# Patient Record
Sex: Male | Born: 1950
Health system: Southern US, Community
[De-identification: ages and names within clinical notes are randomized; demographics above are authoritative.]

## PROBLEM LIST (undated history)

## (undated) DIAGNOSIS — Z8619 Personal history of other infectious and parasitic diseases: Secondary | ICD-10-CM

## (undated) DIAGNOSIS — H209 Unspecified iridocyclitis: Secondary | ICD-10-CM

## (undated) DIAGNOSIS — H3320 Serous retinal detachment, unspecified eye: Secondary | ICD-10-CM

## (undated) DIAGNOSIS — C439 Malignant melanoma of skin, unspecified: Secondary | ICD-10-CM

## (undated) DIAGNOSIS — K219 Gastro-esophageal reflux disease without esophagitis: Secondary | ICD-10-CM

## (undated) DIAGNOSIS — E785 Hyperlipidemia, unspecified: Secondary | ICD-10-CM

## (undated) DIAGNOSIS — I10 Essential (primary) hypertension: Secondary | ICD-10-CM

## (undated) DIAGNOSIS — C801 Malignant (primary) neoplasm, unspecified: Secondary | ICD-10-CM

## (undated) DIAGNOSIS — T8859XA Other complications of anesthesia, initial encounter: Secondary | ICD-10-CM

## (undated) DIAGNOSIS — Z9289 Personal history of other medical treatment: Secondary | ICD-10-CM

## (undated) HISTORY — PX: EYE SURGERY: SHX253

## (undated) HISTORY — PX: APPENDECTOMY: SHX54

## (undated) HISTORY — PX: CATARACT EXTRACTION W/ INTRAOCULAR LENS  IMPLANT, BILATERAL: SHX1307

## (undated) HISTORY — DX: Hyperlipidemia, unspecified: E78.5

## (undated) HISTORY — PX: OTHER SURGICAL HISTORY: SHX169

## (undated) HISTORY — DX: Malignant melanoma of skin, unspecified: C43.9

## (undated) HISTORY — DX: Serous retinal detachment, unspecified eye: H33.20

## (undated) HISTORY — DX: Essential (primary) hypertension: I10

## (undated) HISTORY — DX: Personal history of other medical treatment: Z92.89

## (undated) HISTORY — PX: CORONARY ANGIOGRAM: SHX5786

## (undated) HISTORY — DX: Gastro-esophageal reflux disease without esophagitis: K21.9

## (undated) HISTORY — DX: Unspecified iridocyclitis: H20.9

---

## 1981-02-18 DIAGNOSIS — H209 Unspecified iridocyclitis: Secondary | ICD-10-CM

## 1981-02-18 HISTORY — DX: Unspecified iridocyclitis: H20.9

## 1996-02-19 DIAGNOSIS — Z9289 Personal history of other medical treatment: Secondary | ICD-10-CM

## 1996-02-19 HISTORY — DX: Personal history of other medical treatment: Z92.89

## 1998-11-24 ENCOUNTER — Ambulatory Visit (HOSPITAL_COMMUNITY): Admission: RE | Admit: 1998-11-24 | Discharge: 1998-11-24 | Payer: Self-pay | Admitting: Gastroenterology

## 1998-11-24 ENCOUNTER — Encounter (INDEPENDENT_AMBULATORY_CARE_PROVIDER_SITE_OTHER): Payer: Self-pay | Admitting: Specialist

## 1998-11-24 HISTORY — PX: ESOPHAGOGASTRODUODENOSCOPY: SHX1529

## 2001-11-18 ENCOUNTER — Encounter: Payer: Self-pay | Admitting: Family Medicine

## 2001-11-18 LAB — CONVERTED CEMR LAB: PSA: 0.5 ng/mL

## 2003-12-10 ENCOUNTER — Emergency Department: Payer: Self-pay | Admitting: Unknown Physician Specialty

## 2003-12-10 ENCOUNTER — Other Ambulatory Visit: Payer: Self-pay

## 2003-12-13 ENCOUNTER — Ambulatory Visit: Payer: Self-pay | Admitting: General Surgery

## 2003-12-16 ENCOUNTER — Ambulatory Visit: Payer: Self-pay | Admitting: Cardiology

## 2004-05-18 ENCOUNTER — Ambulatory Visit: Payer: Self-pay | Admitting: Unknown Physician Specialty

## 2004-05-25 ENCOUNTER — Ambulatory Visit: Payer: Self-pay | Admitting: Family Medicine

## 2004-05-28 ENCOUNTER — Ambulatory Visit: Payer: Self-pay | Admitting: Family Medicine

## 2005-06-18 ENCOUNTER — Encounter: Payer: Self-pay | Admitting: Family Medicine

## 2005-06-18 LAB — CONVERTED CEMR LAB: PSA: 0.4 ng/mL

## 2005-07-15 ENCOUNTER — Ambulatory Visit: Payer: Self-pay | Admitting: Family Medicine

## 2005-07-22 ENCOUNTER — Ambulatory Visit: Payer: Self-pay | Admitting: Family Medicine

## 2006-08-18 ENCOUNTER — Encounter: Payer: Self-pay | Admitting: Family Medicine

## 2006-08-18 LAB — CONVERTED CEMR LAB: PSA: 0.6 ng/mL

## 2006-08-21 ENCOUNTER — Encounter: Payer: Self-pay | Admitting: Family Medicine

## 2006-08-28 ENCOUNTER — Encounter: Payer: Self-pay | Admitting: Family Medicine

## 2006-08-28 DIAGNOSIS — K921 Melena: Secondary | ICD-10-CM | POA: Insufficient documentation

## 2006-08-28 DIAGNOSIS — K219 Gastro-esophageal reflux disease without esophagitis: Secondary | ICD-10-CM | POA: Insufficient documentation

## 2006-08-28 DIAGNOSIS — M083 Juvenile rheumatoid polyarthritis (seronegative): Secondary | ICD-10-CM | POA: Insufficient documentation

## 2006-08-28 DIAGNOSIS — I10 Essential (primary) hypertension: Secondary | ICD-10-CM | POA: Insufficient documentation

## 2006-09-02 ENCOUNTER — Ambulatory Visit: Payer: Self-pay | Admitting: Family Medicine

## 2006-11-07 ENCOUNTER — Encounter: Payer: Self-pay | Admitting: Family Medicine

## 2007-09-01 ENCOUNTER — Encounter: Payer: Self-pay | Admitting: Family Medicine

## 2007-09-07 ENCOUNTER — Telehealth: Payer: Self-pay | Admitting: Family Medicine

## 2007-09-08 ENCOUNTER — Encounter: Payer: Self-pay | Admitting: Family Medicine

## 2007-11-04 ENCOUNTER — Telehealth: Payer: Self-pay | Admitting: Family Medicine

## 2007-11-13 ENCOUNTER — Encounter: Payer: Self-pay | Admitting: Family Medicine

## 2007-12-02 ENCOUNTER — Ambulatory Visit: Payer: Self-pay | Admitting: Family Medicine

## 2008-02-03 ENCOUNTER — Telehealth: Payer: Self-pay | Admitting: Family Medicine

## 2008-02-15 ENCOUNTER — Telehealth: Payer: Self-pay | Admitting: Family Medicine

## 2008-07-16 ENCOUNTER — Ambulatory Visit: Payer: Self-pay | Admitting: Unknown Physician Specialty

## 2008-07-16 ENCOUNTER — Encounter: Payer: Self-pay | Admitting: Family Medicine

## 2008-07-16 LAB — HM COLONOSCOPY

## 2008-07-22 DIAGNOSIS — K209 Esophagitis, unspecified without bleeding: Secondary | ICD-10-CM | POA: Insufficient documentation

## 2008-07-22 DIAGNOSIS — D126 Benign neoplasm of colon, unspecified: Secondary | ICD-10-CM | POA: Insufficient documentation

## 2008-07-22 DIAGNOSIS — K2970 Gastritis, unspecified, without bleeding: Secondary | ICD-10-CM | POA: Insufficient documentation

## 2008-07-22 DIAGNOSIS — K2990 Gastroduodenitis, unspecified, without bleeding: Secondary | ICD-10-CM

## 2008-10-28 ENCOUNTER — Telehealth: Payer: Self-pay | Admitting: Internal Medicine

## 2008-11-15 ENCOUNTER — Telehealth (INDEPENDENT_AMBULATORY_CARE_PROVIDER_SITE_OTHER): Payer: Self-pay | Admitting: *Deleted

## 2008-11-17 ENCOUNTER — Encounter: Payer: Self-pay | Admitting: Family Medicine

## 2008-11-24 ENCOUNTER — Ambulatory Visit: Payer: Self-pay | Admitting: Family Medicine

## 2009-01-12 ENCOUNTER — Emergency Department: Payer: Self-pay | Admitting: Emergency Medicine

## 2009-01-12 ENCOUNTER — Encounter: Payer: Self-pay | Admitting: Family Medicine

## 2009-01-18 ENCOUNTER — Encounter: Payer: Self-pay | Admitting: Family Medicine

## 2009-01-18 ENCOUNTER — Ambulatory Visit: Payer: Self-pay | Admitting: Cardiology

## 2009-01-20 ENCOUNTER — Encounter: Payer: Self-pay | Admitting: Family Medicine

## 2009-01-20 ENCOUNTER — Ambulatory Visit: Payer: Self-pay | Admitting: General Surgery

## 2009-06-12 ENCOUNTER — Ambulatory Visit: Payer: Self-pay | Admitting: General Surgery

## 2009-06-14 ENCOUNTER — Encounter: Payer: Self-pay | Admitting: Family Medicine

## 2009-06-14 ENCOUNTER — Ambulatory Visit: Payer: Self-pay | Admitting: Unknown Physician Specialty

## 2009-06-14 HISTORY — PX: ESOPHAGOGASTRODUODENOSCOPY: SHX1529

## 2009-09-21 ENCOUNTER — Encounter (INDEPENDENT_AMBULATORY_CARE_PROVIDER_SITE_OTHER): Payer: Self-pay | Admitting: *Deleted

## 2009-11-30 ENCOUNTER — Telehealth (INDEPENDENT_AMBULATORY_CARE_PROVIDER_SITE_OTHER): Payer: Self-pay | Admitting: *Deleted

## 2009-12-06 ENCOUNTER — Encounter: Payer: Self-pay | Admitting: Family Medicine

## 2009-12-14 ENCOUNTER — Ambulatory Visit: Payer: Self-pay | Admitting: Family Medicine

## 2010-03-20 NOTE — Assessment & Plan Note (Signed)
Summary: CPX/CLE   Vital Signs:  Patient Profile:   60 Years Old Male Height:     70 inches Weight:      195 pounds Temp:     97.8 degrees F oral Pulse rate:   64 / minute Pulse rhythm:   regular BP sitting:   130 / 80  (left arm) Cuff size:   regular  Vitals Entered By: Providence Crosby (September 02, 2006 11:41 AM)               Chief Complaint:  CPX / STATES DOES NOT NEED HEMOCULT CARDS HAD COLONOSCOPY LAST YEAR.  History of Present Illness: No complaints except with lack of attention, has noticed hitting doorjams goig thru doors (for instance). Has not affected his abilities in the OR, more an attention problem or lack thereof. Generally achy the last few weeks. No acute problem. Not particularly having joint aches.  Overall feels reasonably well. Continues to tolerate Diovan/hct well. Has had muliple retinal tears causing floaters and heralding poss retinal detachment so has seen Dr Melinda Crutch frequently for Laser Trmnts in the last 14 mos.  Current Allergies (reviewed today): ! DEMEROL (MEPERIDINE HCL)  Past Surgical History:    Bilateral knee contractures as a child    JRA, 17yoa  HLA B27+    Appendectomy  14yoa    Iritis    Prednisone, ASA, NSAIDS, Phenylbutisone  Flare as Cief Resident 1983    ECHO- EF 55%, Thallium wnl 11/91    Macular Degen Fourescein Angiog (Dr Luciana Axe)  1996    ETT with Juanito Doom 12/98    Colonoscopy- Medoff, polypectomy  11/24/98    EGD- esophgitis with stricture, HH, gastritis 11/24/98    CATH, min dz, mild LAD EF 72% 12/16/03    Colonoscopy- wnl- no polyps 2004    EGD-Elliott wnl, bx negative, H.Pylori negative 05/19/04    Risk Factors:  Passive smoke exposure:  no Drug use:  no HIV high-risk behavior:  no Caffeine use:  3 drinks per day Alcohol use:  yes    Type:  all types    Drinks per day:  1    Has patient --       Felt need to cut down:  no       Been annoyed by complaints:  no       Felt guilty about drinking:  no       Needed eye  opener in the morning:  no    Counseled to quit/cut down alcohol use:  no Exercise:  yes    Times per week:  5    Type:  walking Seatbelt use:  100 %   Review of Systems  General      Denies chills, fatigue, fever, loss of appetite, malaise, sleep disorder, sweats, weakness, and weight loss.  Eyes      Denies blurring, discharge, double vision, eye irritation, eye pain, halos, itching, light sensitivity, red eye, vision loss-1 eye, and vision loss-both eyes.      sees Dr Melinda Crutch, multiple retinal tears last year.  ENT      Complains of decreased hearing.      Denies difficulty swallowing, ear discharge, earache, hoarseness, nasal congestion, nosebleeds, postnasal drainage, ringing in ears, sinus pressure, and sore throat.      mild  CV      Denies bluish discoloration of lips or nails, chest pain or discomfort, difficulty breathing at night, difficulty breathing while lying down, fainting, fatigue, leg cramps with  exertion, lightheadness, near fainting, palpitations, shortness of breath with exertion, swelling of feet, swelling of hands, and weight gain.  Resp      Denies chest discomfort, chest pain with inspiration, cough, coughing up blood, excessive snoring, hypersomnolence, morning headaches, pleuritic, shortness of breath, sputum productive, and wheezing.  GI      Denies abdominal pain, bloody stools, change in bowel habits, constipation, dark tarry stools, diarrhea, excessive appetite, gas, hemorrhoids, indigestion, loss of appetite, nausea, vomiting, vomiting blood, and yellowish skin color.  GU      Complains of nocturia.      Denies decreased libido, discharge, dysuria, erectile dysfunction, genital sores, hematuria, incontinence, urinary frequency, and urinary hesitancy.      improved  MS      Denies joint pain, joint redness, joint swelling, loss of strength, low back pain, mid back pain, muscle aches, muscle , cramps, muscle weakness, stiffness, and thoracic  pain.  Derm      Denies changes in color of skin, changes in nail beds, dryness, excessive perspiration, flushing, hair loss, insect bite(s), itching, lesion(s), poor wound healing, and rash.   Physical Exam  General:     Well-developed,well-nourished,in no acute distress; alert,appropriate and cooperative throughout examination Head:     Normocephalic and atraumatic without obvious abnormalities. No apparent alopecia or balding. Eyes:     Conjunctiva clear bilaterally.  Ears:     External ear exam shows no significant lesions or deformities.  Otoscopic examination reveals clear canals, tympanic membranes are intact bilaterally without bulging, retraction, inflammation or discharge. Hearing is grossly normal bilaterally. Nose:     External nasal examination shows no deformity or inflammation. Nasal mucosa are pink and moist without lesions or exudates. Mouth:     Oral mucosa and oropharynx without lesions or exudates.  Teeth in good repair. Neck:     No deformities, masses, or tenderness noted. Chest Wall:     No deformities, masses, tenderness or gynecomastia noted. Breasts:     No masses or gynecomastia noted Lungs:     Normal respiratory effort, chest expands symmetrically. Lungs are clear to auscultation, no crackles or wheezes. Heart:     Normal rate and regular rhythm. S1 and S2 normal without gallop, murmur, click, rub or other extra sounds. Abdomen:     Bowel sounds positive,abdomen soft and non-tender without masses, organomegaly or hernias noted. Rectal:     No external abnormalities noted. Normal sphincter tone. No rectal masses or tenderness. G neg. Genitalia:     Testes bilaterally descended without nodularity, tenderness or masses. No scrotal masses or lesions. No penis lesions or urethral discharge. Prostate:     Prostate gland firm and smooth, no enlargement, nodularity, tenderness, mass, asymmetry or induration. 20gms. Msk:     No deformity or scoliosis noted  of thoracic or lumbar spine.   Pulses:     R and L carotid,radial,femoral,dorsalis pedis and posterior tibial pulses are full and equal bilaterally Extremities:     No clubbing, cyanosis, edema, or deformity noted with normal full range of motion of all joints.   Neurologic:     No cranial nerve deficits noted. Station and gait are normal. Plantar reflexes are down-going bilaterally. DTRs are symmetrical throughout. Sensory, motor and coordinative functions appear intact. Skin:     Intact without suspicious lesions or rashes. Has B9 flat hyperpigmented macule R uppper chest just below the R mid clavicle and B9 allergic rash in left neck area probably from his surgical mask (got out of  surgery just before getting here.) Cervical Nodes:     No lymphadenopathy noted Inguinal Nodes:     No significant adenopathy Psych:     Cognition and judgment appear intact. Alert and cooperative with normal attention span and concentration. No apparent delusions, illusions, hallucinations    Impression & Recommendations:  Problem # 1:  HEALTH MAINTENANCE EXAM (ICD-V70.0) Assessment: Comment Only  Orders: No Charge Patient Arrived ([id number])   Problem # 2:  HYPERTENSION (ICD-401.9) Assessment: Unchanged  His updated medication list for this problem includes:    Diovan Hct 160-25 Mg Tabs (Valsartan-hydrochlorothiazide) .Marland Kitchen... Take one by mouth daily  BP today: 130/80   Problem # 3:  HYPERCHOLESTEROLEMIA, 160/ HDL 36 /LDL 108 (ICD-272.0) Assessment: Improved Cont as is LDL 103 HDL 39  Trig 111  Problem # 4:  HEMATOCHEZIA (ICD-578.1) Assessment: Improved Colonoscopy nml last year.  Problem # 5:  GERD (ICD-530.81) Assessment: Improved Stable.  Medications Added to Medication List This Visit: 1)  Bl Vitamin E 400 Unit Caps (Vitamin e) .Marland Kitchen.. 1 qd   Patient Instructions: 1)  RTC 1 year, sooner as needed.    Prescriptions: DIOVAN HCT 160-25 MG  TABS (VALSARTAN-HYDROCHLOROTHIAZIDE) Take one  by mouth daily  #30 x prn   Entered and Authorized by:   Shaune Leeks MD   Signed by:   Shaune Leeks MD on 09/02/2006   Method used:   Print then Give to Patient   RxID:   [id number]       Appended Document: CPX/CLE Script for [employer] called to Karin Golden , [place] [phone] for prolonged cough with min white production for grtr one month.

## 2010-03-20 NOTE — Progress Notes (Signed)
  Phone Note Call from Patient   Summary of Call: Patient called  requesting order for labs prior to his cpx coming up. Rx for labs faxed to [phone] at patients request.  Initial call taken by: Melody Comas,  November 30, 2009 2:54 PM

## 2010-03-20 NOTE — Progress Notes (Signed)
  Phone Note Call from Patient Call back at Work Phone [phone]   Caller: Patient Call For: Dr.Schaller Summary of Call: Pt. is coming in for CPX on 11/24/08 @ 8:00.  He requested a lab order be faxed to his office,so he can have his lab results before the CPX.  Please fax lab order to pt @ [phone]. Initial call taken by: Beau Fanny,  November 15, 2008 11:01 AM  Follow-up for Phone Call        Done. Follow-up by: Shaune Leeks MD,  November 15, 2008 1:55 PM  Additional Follow-up for Phone Call Additional follow up Details #1::        Lab Order faxed to patient. Additional Follow-up by: Beau Fanny,  November 15, 2008 1:59 PM

## 2010-03-20 NOTE — Procedures (Signed)
Summary: Upper GI Endoscopy by Dr.Robert [facility]  Upper GI Endoscopy by Dr.Robert [facility]   Imported By: Beau Fanny 07/22/2008 08:59:43  _____________________________________________________________________  External Attachment:    Type:   Image     Comment:   External Document

## 2010-03-20 NOTE — Progress Notes (Signed)
----   Converted from flag ---- ---- 09/07/2007 9:46 AM, Providence Crosby wrote: DR. Doristine Counter CALLED JUST AFEW MINUTES AGO  WANTS YOU TO CALL HIM WHEN YOU CAN AT [phone] C/O SOME DIZZINESS AND BP UP SOME // JUST WANTS YOUR OPINION ------------------------------ Was travelling over the weekend, driving in car a lot both ways with junk food, increased salt intake...give 24 hrs tio see if improves. Has done a procedure and seeing pts in office w/o diff...does not sound like stroke. Give 24 hrs and recheck...call me if no improvement.

## 2010-03-20 NOTE — Letter (Signed)
Summary: Nadara Eaton letter  [place] [facility]  [address] [place], [place] [address]   Phone: [phone]  Fax: [phone]       09/21/2009 MRN: [id number]  Brentyn Venters [address] [place], [place]  [address]  Dear Mr. [facility] [place] [place] announce the retirement of Arta Silence, M.D., from full-time practice at the [facility] office effective August 17, 2009 and his plans of returning part-time.  It is important to Dr. Hetty Ely and to our practice that you understand that [facility] has seven physicians in our office for your health care needs.  We will continue to offer the same exceptional care that you have today.    Dr. Hetty Ely has spoken to many of you about his plans for retirement and returning part-time in the fall.   We will continue to work with you through the transition to schedule appointments for you in the office and meet the high standards that [facility] is committed to.   Again, it is with great pleasure that we share the news that Dr. Hetty Ely will return to [facility] in October of 2011 with a reduced schedule.    If you have any questions, or would like to request an appointment with one of our physicians, please call us at [phone] and press the option for Scheduling an appointment.  We take pleasure in providing you with excellent patient care and look forward to seeing you at your next office visit.  Our [facility] Physicians are:  Tillman Abide, M.D. Laurita Quint, M.D. Roxy Manns, M.D. Kerby Nora, M.D. Hannah Beat, M.D. Ruthe Mannan, M.D. We proudly welcomed Raechel Ache, M.D. and Eustaquio Boyden, M.D. to the practice in July/August 2011.  Sincerely,  [facility]

## 2010-03-20 NOTE — Progress Notes (Signed)
Summary: DR.BRYNETT  Phone Note Call from Patient   Caller: Patient Call For: DR. Epimenio Schetter Summary of Call: HAS COUGH FEELS LIKE HE IS COUGHING UP A LUNG  FOR 5 TO 6 DAYS CLEAR WHITE SPUTUM NO FEVER; WIFE STATES HE SHOULD CALL YOU // PAGER NUMBER [phone] Initial call taken by: Providence Crosby,  February 03, 2008 10:12 AM  Follow-up for Phone Call        Discussed and suggested approach. he'll call if continues. Follow-up by: Shaune Leeks MD,  February 03, 2008 10:29 AM

## 2010-03-20 NOTE — Assessment & Plan Note (Signed)
Summary: CPX/CLE   Vital Signs:  Patient profile:   60 year old male Height:      70 inches Weight:      196 pounds BMI:     28.22 Temp:     97.7 degrees F oral Pulse rate:   60 / minute Pulse rhythm:   regular BP sitting:   112 / 70  (left arm) Cuff size:   regular  Vitals Entered By: Sydell Axon LPN (November 24, 2008 8:09 AM) CC: 30 Minute checkup   History of Present Illness: Pt here for Comp Exam, doing well with no complaints.  Preventive Screening-Counseling & Management  Alcohol-Tobacco     Alcohol drinks/day: 2     Alcohol type: all types     Smoking Status: never     Passive Smoke Exposure: no  Caffeine-Diet-Exercise     Caffeine use/day: 3     Does Patient Exercise: yes     Type of exercise: walking     Times/week: 3  Problems Prior to Update: 1)  Esophagitis  (ICD-530.10) 2)  Gastritis  (ICD-535.50) 3)  Colonic Polyps, Adenomatous  (ICD-211.3) 4)  Health Maintenance Exam  (ICD-V70.0) 5)  Hypercholesterolemia, 160/ Hdl 36 /ldl 108  (ICD-272.0) 6)  Hematochezia  (ICD-578.1) 7)  Dysphagia, Unspec (STRICTURE VIA EGD)  (ICD-787.20) 8)  Tinnitus, Scondary Asa, Nasal Septrum Perf  (ICD-388.30) 9)  Reiter's Disease (URETHR, IRIDO, ARTHR)  (ICD-099.3) 10)  Arthritis, Rheumatoid, Juvenile  (ICD-714.30) 11)  Mitral Valve Prolapse, Mild With H/o Pat  (ICD-424.0) 12)  Hypertension  (ICD-401.9) 13)  Gerd  (ICD-530.81)  Medications Prior to Update: 1)  Icaps .... Take One By Mouth Daily 2)  Diovan Hct 160-25 Mg  Tabs (Valsartan-Hydrochlorothiazide) .... Take One By Mouth Daily 3)  Bl Vitamin E 400 Unit  Caps (Vitamin E) .Marland Kitchen.. 1 Qd 4)  Zithromax Tri-Pak 500 Mg Tabs (Azithromycin) .... As Dir 5)  Tessalon 200 Mg Caps (Benzonatate) .... One Cap By Mouth Three Times A Day As Needed Cough 6)  Tussionex Pennkinetic Er 8-10 Mg/80ml Lqcr (Chlorpheniramine-Hydrocodone) .... One Tsp By Mouth At Night As Needed Cough.  Allergies: 1)  ! Demerol (Meperidine Hcl)  Past  History:  Past Medical History: Last updated: 08/28/2006 GERD Hypertension  Family History: Last updated: 11/24/2008 Father: A 79  one-vessel coronary artery disease HTN  Lipitor Mother: dec died from intracerebral hemm on Coumadin with hypertension, bypass graft, a stroke from atrial fibrillation, also on Lipitor. She continues to smoke and has had leg claudication.  Siblings: One younger brother by 7 years mild cardiac disease  Intermmittent SVT Diabetes- MGM. Intestional polyps in a GM at 60 years of age  Social History: Last updated: 08/28/2006 Marital Status: Married Children: One son and daughter Occupation: Careers adviser at [employer]  Risk Factors: Alcohol Use: 2 (11/24/2008) Caffeine Use: 3 (11/24/2008) Exercise: yes (11/24/2008)  Risk Factors: Smoking Status: never (11/24/2008) Passive Smoke Exposure: no (11/24/2008)  Past Surgical History: Bilateral knee contractures as a child JRA, 17yoa  HLA B27+ Appendectomy  14yoa Iritis    Prednisone, ASA, NSAIDS, Phenylbutisone  Flare as Cief Resident 1983 ECHO- EF 55%, Thallium wnl 11/91 Macular Degen Fourescein Angiog (Dr Luciana Axe)  1996 ETT with Juanito Doom 12/98 Colonoscopy- Medoff, polypectomy  11/24/98 EGD- esophgitis with stricture, HH, gastritis 11/24/98 CATH, min dz, mild LAD EF 72% 12/16/03 Colonoscopy- wnl- no polyps 2004  repeat 09 EGD-Elliott wnl, bx negative, H.Pylori negative 05/19/04 Colonoscopy Polyp Int Hemms Divertics throughout (Dr Mechele Collin)  07/16/08   5 yrs(suggested 3 yrs)  Family History: Father: A 79  one-vessel coronary artery disease HTN  Lipitor Mother: dec died from intracerebral hemm on Coumadin with hypertension, bypass graft, a stroke from atrial fibrillation, also on Lipitor. She continues to smoke and has had leg claudication.  Siblings: One younger brother by 7 years mild cardiac disease  Intermmittent SVT Diabetes- MGM. Intestional polyps in a GM at 60 years of  age  Review of Systems General:  Denies chills, fatigue, fever, loss of appetite, malaise, sleep disorder, sweats, weakness, and weight loss. Eyes:  Complains of blurring; denies discharge, double vision, eye irritation, eye pain, halos, itching, light sensitivity, red eye, vision loss-1 eye, and vision loss-both eyes; Sees dr Melinda Crutch. ENT:  Complains of ringing in ears; chronic saw Dr Elenore Rota. CV:  Denies bluish discoloration of lips or nails, chest pain or discomfort, difficulty breathing at night, difficulty breathing while lying down, fainting, fatigue, leg cramps with exertion, lightheadness, near fainting, palpitations, shortness of breath with exertion, swelling of feet, swelling of hands, and weight gain. Resp:  Complains of cough; denies chest discomfort, chest pain with inspiration, coughing up blood, excessive snoring, hypersomnolence, morning headaches, pleuritic, shortness of breath, sputum productive, and wheezing; see HPI. GI:  Denies abdominal pain, bloody stools, change in bowel habits, constipation, dark tarry stools, diarrhea, excessive appetite, gas, hemorrhoids, indigestion, loss of appetite, nausea, vomiting, vomiting blood, and yellowish skin color. GU:  Denies decreased libido, discharge, dysuria, erectile dysfunction, genital sores, hematuria, incontinence, nocturia, urinary frequency, and urinary hesitancy; slower. MS:  Complains of joint pain; JRA still with nonlimiting arthropathy.. Derm:  Denies changes in color of skin, changes in nail beds, dryness, excessive perspiration, flushing, hair loss, insect bite(s), itching, lesion(s), poor wound healing, and rash. Neuro:  Denies brief paralysis, difficulty with concentration, disturbances in coordination, falling down, headaches, inability to speak, memory loss, numbness, poor balance, seizures, sensation of room spinning, tingling, tremors, visual disturbances, and weakness.  Physical Exam  General:   Well-developed,well-nourished,in no acute distress; alert,appropriate and cooperative throughout examination Head:  Normocephalic and atraumatic without obvious abnormalities. No apparent alopecia or balding. Eyes:  Conjunctiva clear bilaterally.  Ears:  External ear exam shows no significant lesions or deformities.  Otoscopic examination reveals clear canals, tympanic membranes are intact bilaterally without bulging, retraction, inflammation or discharge. Hearing is grossly normal bilaterally. Nose:  External nasal examination shows no deformity or inflammation. Nasal mucosa are pink and moist without lesions or exudates. Septal defect due to high ASA as a child for JRA Mouth:  Oral mucosa and oropharynx without lesions or exudates.  Teeth in good repair. Neck:  No deformities, masses, or tenderness noted. Chest Wall:  No deformities, masses, tenderness or gynecomastia noted. Breasts:  No masses or gynecomastia noted Lungs:  Normal respiratory effort, chest expands symmetrically. Lungs are clear to auscultation, no crackles or wheezes. Heart:  Normal rate and regular rhythm. S1 and S2 normal without gallop, murmur, click, rub or other extra sounds. Abdomen:  Bowel sounds positive,abdomen soft and non-tender without masses, organomegaly or hernias noted. Liver edge felt on deep palpation, still 8cm MCL. Rectal:  No external abnormalities noted. Normal sphincter tone. No rectal masses or tenderness. G neg. Genitalia:  Testes bilaterally descended without nodularity, tenderness or masses. No scrotal masses or lesions. No penis lesions or urethral discharge. Prostate:  Prostate gland firm and smooth, no enlargement, nodularity, tenderness, mass, minimal asymmetry with left pole slightly larger than right. No palpable  induration. 20gms. Msk:  No deformity or scoliosis noted of thoracic or lumbar spine.   Pulses:  R and L carotid,radial,femoral,dorsalis pedis and posterior tibial pulses are full and  equal bilaterally Extremities:  No clubbing, cyanosis, edema, or deformity noted with normal full range of motion of all joints.  Minimal evidence of arthropathy grossly. Neurologic:  No cranial nerve deficits noted. Station and gait are normal. Plantar reflexes are down-going bilaterally. DTRs are symmetrical throughout. Sensory, motor and coordinative functions appear intact. Skin:  Intact without suspicious lesions or rashes except 1 cm hyperpigmented lesion just left side of manubrial area of superior sternum, unchaged. Cervical Nodes:  No lymphadenopathy noted Inguinal Nodes:  No significant adenopathy Psych:  Cognition and judgment appear intact. Alert and cooperative with normal attention span and concentration. No apparent delusions, illusions, hallucinations   Impression & Recommendations:  Problem # 1:  HEALTH MAINTENANCE EXAM (ICD-V70.0) Assessment Comment Only Awaiting labs from [facility]. Will call him with results.  Problem # 2:  ESOPHAGITIS (ICD-530.10) Assessment: Unchanged Stable, thought to possibly be eosinophillic esophagitis. Abhors pills , esp steroids.  Problem # 3:  GASTRITIS (ICD-535.50) Assessment: Unchanged Stable. Takes PPI occas.  Problem # 4:  COLONIC POLYPS, ADENOMATOUS (ICD-211.3) Assessment: Unchanged Will have repeart Colonoscopy in 2015.   Problem # 5:  HYPERCHOLESTEROLEMIA, 160/ HDL 36 /LDL 108 (ICD-272.0) Assessment: Unchanged Awaiting recheck.  Problem # 6:  HEMATOCHEZIA (ICD-578.1) Assessment: Improved None recently.  Problem # 7:  TINNITUS, SCONDARY ASA, NASAL SEPTRUM PERF (ICD-388.30) Assessment: Unchanged Sees ENT. Has been aware of mild dysequilibrium at times...does not appear significantly progressive. Do not think Meniere's at this juncture. Hearing loss ios high freqeuncy and does not appear progressive.  Problem # 8:  MITRAL VALVE PROLAPSE, MILD WITH H/O PAT (ICD-424.0) Assessment: Unchanged Stable, no click heard.  Assymptomatic.  Problem # 9:  HYPERTENSION (ICD-401.9) Assessment: Unchanged Stable His updated medication list for this problem includes:    Diovan Hct 160-25 Mg Tabs (Valsartan-hydrochlorothiazide) .Marland Kitchen... Take one by mouth daily  BP today: 112/70 Prior BP: 120/70 (12/02/2007)  Complete Medication List: 1)  Icaps  .... Take one by mouth daily 2)  Diovan Hct 160-25 Mg Tabs (Valsartan-hydrochlorothiazide) .... Take one by mouth daily  Current Allergies (reviewed today): ! DEMEROL (MEPERIDINE HCL)   Preventive Care Screening  Colonoscopy:    Date:  07/16/2008    Results:  Adenomatous Polyp

## 2010-03-20 NOTE — Procedures (Signed)
Summary: Upper GI Endoscopy by Dr.Jonne Rote [facility]  Upper GI Endoscopy by Dr.Rosalina [facility]   Imported By: Beau Fanny 06/19/2009 15:07:00  _____________________________________________________________________  External Attachment:    Type:   Image     Comment:   External Document  Appended Document: Upper GI Endoscopy by Dr.Kathleen Tamm [facility]    Clinical Lists Changes  Observations: Added new observation of PAST SURG HX: Bilateral knee contractures as a child JRA, 17yoa  HLA B27+ Appendectomy  14yoa Iritis    Prednisone, ASA, NSAIDS, Phenylbutisone  Flare as Cief Resident 1983 ECHO- EF 55%, Thallium wnl 11/91 Macular Degen Fourescein Angiog (Dr Luciana Axe)  1996 ETT with Juanito Doom 12/98 Colonoscopy- Medoff, polypectomy  11/24/98 EGD- esophgitis with stricture, HH, gastritis 11/24/98 CATH, min dz, mild LAD EF 72% 12/16/03 Colonoscopy- wnl- no polyps 2004  repeat 09 EGD- wnl, bx negative, H.Pylori negative (Dr Mechele Collin) 05/19/04 Colonoscopy Polyp Int Hemms Divertics throughout (Dr Mechele Collin) 07/16/08   5 yrs(suggested 3 yrs) EGD Esophagitis poss [facility] Gastritis (Dr Mechele Collin) 06/14/2009  (06/19/2009 17:47)       Past Surgical History:    Bilateral knee contractures as a child    JRA, 17yoa  HLA B27+    Appendectomy  14yoa    Iritis    Prednisone, ASA, NSAIDS, Phenylbutisone  Flare as Cief Resident 1983    ECHO- EF 55%, Thallium wnl 11/91    Macular Degen Fourescein Angiog (Dr Luciana Axe)  1996    ETT with Juanito Doom 12/98    Colonoscopy- Medoff, polypectomy  11/24/98    EGD- esophgitis with stricture, HH, gastritis 11/24/98    CATH, min dz, mild LAD EF 72% 12/16/03    Colonoscopy- wnl- no polyps 2004  repeat 09    EGD- wnl, bx negative, H.Pylori negative (Dr Mechele Collin) 05/19/04    Colonoscopy Polyp Int Hemms Divertics throughout (Dr Mechele Collin) 07/16/08   5 yrs(suggested 3 yrs)    EGD Esophagitis poss [facility] Gastritis (Dr Mechele Collin) 06/14/2009

## 2010-03-20 NOTE — Progress Notes (Signed)
Summary: wants order for labs  Phone Note Call from Patient   Caller: Patient Call For: dr schaller Summary of Call: Pt is requesting an order for lab work be faxed to [facility]- he needs to schedule a physical but he said he would do that after he gets his labs.  He wants order faxed to [facility] on [address],  fax [phone] Initial call taken by: Lowella Petties,  November 04, 2007 10:00 AM  Follow-up for Phone Call        Script written. Follow-up by: Shaune Leeks MD,  November 04, 2007 10:38 AM  Additional Follow-up for Phone Call Additional follow up Details #1::        order faxed Additional Follow-up by: Lowella Petties,  November 04, 2007 11:48 AM      Appended Document: wants order for labs faxed labs to his office

## 2010-03-20 NOTE — Assessment & Plan Note (Signed)
Summary: CPX PER GAIL?DLO   Vital Signs:  Patient Profile:   60 Years Old Male Height:     70 inches Weight:      190 pounds Temp:     97.4 degrees F oral Pulse rate:   64 / minute Pulse rhythm:   regular BP sitting:   120 / 70  (left arm) Cuff size:   regular  Vitals Entered By: Providence Crosby (December 02, 2007 10:37 AM)                 Chief Complaint:  check up.  History of Present Illness: Pt has had tinnitus for about ten months...had it before when on high dose ASA as adolescent for JRA Also has tingling     Prior Medications Reviewed Using: Patient Recall  Current Allergies (reviewed today): ! DEMEROL (MEPERIDINE HCL)  Past Surgical History:    Bilateral knee contractures as a child    JRA, 17yoa  HLA B27+    Appendectomy  14yoa    Iritis    Prednisone, ASA, NSAIDS, Phenylbutisone  Flare as Cief Resident 1983    ECHO- EF 55%, Thallium wnl 11/91    Macular Degen Fourescein Angiog (Dr Luciana Axe)  1996    ETT with Juanito Doom 12/98    Colonoscopy- Medoff, polypectomy  11/24/98    EGD- esophgitis with stricture, HH, gastritis 11/24/98    CATH, min dz, mild LAD EF 72% 12/16/03    Colonoscopy- wnl- no polyps 2004  repeat 09    EGD-Elliott wnl, bx negative, H.Pylori negative 05/19/04   Family History:    Father: Alive with one-vessel coronary artery disease, on Lipitor    Mother: dec died from intracerebral hemm on Coumadin with hypertension, bypass graft, a stroke from atrial fibrillation, also on Lipitor. She continues to smoke and has had leg claudication.     Siblings: One younger brother by 7 years mild cardiac disease    Diabetes- MGM.    Intestional polyps in a GM at 60 years of age       Risk Factors:     Drinks per day:  2   Review of Systems  General      Complains of weight loss.  Eyes      Denies blurring, discharge, double vision, eye irritation, eye pain, halos, itching, light sensitivity, red eye, vision loss-1 eye, and vision loss-both  eyes.      bilat retinal detachment  ENT      Complains of ringing in ears.      Denies decreased hearing, difficulty swallowing, ear discharge, earache, hoarseness, nasal congestion, nosebleeds, postnasal drainage, sinus pressure, and sore throat.  Resp      Denies chest discomfort, chest pain with inspiration, cough, coughing up blood, excessive snoring, hypersomnolence, morning headaches, pleuritic, shortness of breath, sputum productive, and wheezing.  GI      Complains of indigestion.  GU      Denies decreased libido, discharge, dysuria, erectile dysfunction, genital sores, hematuria, incontinence, nocturia, urinary frequency, and urinary hesitancy.  MS      Denies joint pain, joint redness, joint swelling, loss of strength, low back pain, mid back pain, muscle aches, muscle , cramps, muscle weakness, stiffness, and thoracic pain.  Derm      Denies changes in color of skin, changes in nail beds, dryness, excessive perspiration, flushing, hair loss, insect bite(s), itching, lesion(s), poor wound healing, and rash.   Physical Exam  General:     Well-developed,well-nourished,in no acute distress;  alert,appropriate and cooperative throughout examination Head:     Normocephalic and atraumatic without obvious abnormalities. No apparent alopecia or balding. Eyes:     Conjunctiva clear bilaterally.  Ears:     External ear exam shows no significant lesions or deformities.  Otoscopic examination reveals clear canals, tympanic membranes are intact bilaterally without bulging, retraction, inflammation or discharge. Hearing is grossly normal bilaterally. Nose:     External nasal examination shows no deformity or inflammation. Nasal mucosa are pink and moist without lesions or exudates. Mouth:     Oral mucosa and oropharynx without lesions or exudates.  Teeth in good repair. Neck:     No deformities, masses, or tenderness noted. Chest Wall:     No deformities, masses, tenderness or  gynecomastia noted. Breasts:     No masses or gynecomastia noted Lungs:     Normal respiratory effort, chest expands symmetrically. Lungs are clear to auscultation, no crackles or wheezes. Heart:     Normal rate and regular rhythm. S1 and S2 normal without gallop, murmur, click, rub or other extra sounds. Abdomen:     Bowel sounds positive,abdomen soft and non-tender without masses, organomegaly or hernias noted. Liver edge felt on deep palpation, still 8cm MCL. Rectal:     No external abnormalities noted. Normal sphincter tone. No rectal masses or tenderness. G neg. Genitalia:     Testes bilaterally descended without nodularity, tenderness or masses. No scrotal masses or lesions. No penis lesions or urethral discharge. Prostate:     Prostate gland firm and smooth, no enlargement, nodularity, tenderness, mass, asymmetry or induration. 20gms. Msk:     No deformity or scoliosis noted of thoracic or lumbar spine.   Pulses:     R and L carotid,radial,femoral,dorsalis pedis and posterior tibial pulses are full and equal bilaterally Extremities:     No clubbing, cyanosis, edema, or deformity noted with normal full range of motion of all joints.   Neurologic:     No cranial nerve deficits noted. Station and gait are normal. Plantar reflexes are down-going bilaterally. DTRs are symmetrical throughout. Sensory, motor and coordinative functions appear intact. Skin:     Intact without suspicious lesions or rashes except 1 cm hyperpigmented lesion just left side of manubrial area of superior sternum. Cervical Nodes:     No lymphadenopathy noted Inguinal Nodes:     No significant adenopathy Psych:     Cognition and judgment appear intact. Alert and cooperative with normal attention span and concentration. No apparent delusions, illusions, hallucinations    Impression & Recommendations:  Problem # 1:  HEALTH MAINTENANCE EXAM (ICD-V70.0) Assessment: Deteriorated  Problem # 2:   HYPERCHOLESTEROLEMIA, 160/ HDL 36 /LDL 108 (ICD-272.0) Assessment: Unchanged Will improve with continued wt loss. LDL 130+  Problem # 3:  DYSPHAGIA, UNSPECIFIED (ICD-787.20) Assessment: Improved Significantly improved with his weight loss.  A true Motivator!!  Problem # 4:  TINNITUS, SCONDARY ASA, NASAL SEPTRUM PERF (ICD-388.30) Assessment: Unchanged  Problem # 5:  HYPERTENSION (ICD-401.9) Assessment: Improved  His updated medication list for this problem includes:    Diovan Hct 160-25 Mg Tabs (Valsartan-hydrochlorothiazide) .Marland Kitchen... Take one by mouth daily  BP today: 120/70 Prior BP: 130/80 (09/02/2006)   Complete Medication List: 1)  Icaps  .... Take one by mouth daily 2)  Diovan Hct 160-25 Mg Tabs (Valsartan-hydrochlorothiazide) .... Take one by mouth daily 3)  Bl Vitamin E 400 Unit Caps (Vitamin e) .Marland Kitchen.. 1 qd   Patient Instructions: 1)  RTC as needed.  2)  Encouraged  to cont low carb lifestyle.   ]  Influenza Immunization History:    Influenza # 1:  Fluvax 3+ (11/13/2007)

## 2010-03-20 NOTE — Progress Notes (Signed)
  Phone Note Call from Patient   Caller: Patient Summary of Call: has been exposed to sick daughter Has had cough for 3-4 days some clear to cream colored mucus No sig fever Not SOB  doesn't really feel sick  P: discussed symptomatic care    will consider antibiotics if worsens or doesn't clear Initial call taken by: Cindee Salt MD,  October 28, 2008 8:45 AM

## 2010-03-20 NOTE — Letter (Signed)
Summary: [facility] Reappointment Application  [facility] Reappointment Application   Imported By: Beau Fanny 09/02/2007 08:56:12  _____________________________________________________________________  External Attachment:    Type:   Image     Comment:   External Document

## 2010-03-20 NOTE — Procedures (Signed)
Summary: Colonoscopy by Dr.Robert [facility]  Colonoscopy by Dr.Robert [facility]   Imported By: Beau Fanny 07/22/2008 08:54:54  _____________________________________________________________________  External Attachment:    Type:   Image     Comment:   External Document  Appended Document: Colonoscopy by Dr.Robert [facility]    Clinical Lists Changes  Problems: Changed problem from DYSPHAGIA, UNSPECIFIED (ICD-787.20) to DYSPHAGIA, UNSPEC (STRICTURE VIA EGD) (ICD-787.20) Added new problem of COLONIC POLYPS, ADENOMATOUS (ICD-211.3) Added new problem of GASTRITIS (ICD-535.50) Added new problem of ESOPHAGITIS (ICD-530.10) Observations: Added new observation of PAST SURG HX: Bilateral knee contractures as a child JRA, 17yoa  HLA B27+ Appendectomy  14yoa Iritis    Prednisone, ASA, NSAIDS, Phenylbutisone  Flare as Cief Resident 1983 ECHO- EF 55%, Thallium wnl 11/91 Macular Degen Fourescein Angiog (Dr Luciana Axe)  1996 ETT with Juanito Doom 12/98 Colonoscopy- Medoff, polypectomy  11/24/98 EGD- esophgitis with stricture, HH, gastritis 11/24/98 CATH, min dz, mild LAD EF 72% 12/16/03 Colonoscopy- wnl- no polyps 2004  repeat 09 EGD-Elliott wnl, bx negative, H.Pylori negative 05/19/04 Colonoscopy Polyp Int Hemms Divertics throughout (Dr Mechele Collin) 07/16/08 EGD Reflux Esophagitis Bx  Gastritis (Dr Mechele Collin) 07/16/08  (07/22/2008 9:46)       Past Surgical History:    Bilateral knee contractures as a child    JRA, 17yoa  HLA B27+    Appendectomy  14yoa    Iritis    Prednisone, ASA, NSAIDS, Phenylbutisone  Flare as Cief Resident 1983    ECHO- EF 55%, Thallium wnl 11/91    Macular Degen Fourescein Angiog (Dr Luciana Axe)  1996    ETT with Juanito Doom 12/98    Colonoscopy- Medoff, polypectomy  11/24/98    EGD- esophgitis with stricture, HH, gastritis 11/24/98    CATH, min dz, mild LAD EF 72% 12/16/03    Colonoscopy- wnl- no polyps 2004  repeat 09    EGD-Elliott wnl, bx negative, H.Pylori  negative 05/19/04    Colonoscopy Polyp Int Hemms Divertics throughout (Dr Mechele Collin) 07/16/08    EGD Reflux Esophagitis Bx  Gastritis (Dr Mechele Collin) 07/16/08

## 2010-03-20 NOTE — Assessment & Plan Note (Signed)
Summary: CPX...   Vital Signs:  Patient profile:   60 year old male Height:      70 inches Weight:      193.25 pounds BMI:     27.83 Temp:     97.8 degrees F oral Pulse rate:   60 / minute Pulse rhythm:   regular BP sitting:   118 / 78  (left arm) Cuff size:   regular  Vitals Entered By: Sydell Axon LPN (December 14, 2009 8:27 AM) CC: 30 minute checkup   History of Present Illness: Pt here for Comp Exam, feels well with no complaints. He has some issues. Sep he had posterior headaches daily. Bp was mildly up during this time. Has some difficulty with walking in the dark, visual cues lower.  Has less empathy lately.   Preventive Screening-Counseling & Management  Alcohol-Tobacco     Alcohol drinks/day: 2     Alcohol type: all types     Smoking Status: never     Passive Smoke Exposure: no  Caffeine-Diet-Exercise     Caffeine use/day: 3     Does Patient Exercise: yes     Type of exercise: walking     Times/week: 3  Problems Prior to Update: 1)  Esophagitis  (ICD-530.10) 2)  Gastritis  (ICD-535.50) 3)  Colonic Polyps, Adenomatous  (ICD-211.3) 4)  Health Maintenance Exam  (ICD-V70.0) 5)  Hypercholesterolemia, 160/ Hdl 36 /ldl 108  (ICD-272.0) 6)  Hematochezia  (ICD-578.1) 7)  Dysphagia, Unspec (STRICTURE VIA EGD)  (ICD-787.20) 8)  Tinnitus, Scondary Asa, Nasal Septrum Perf  (ICD-388.30) 9)  Reiter's Disease (URETHR, IRIDO, ARTHR)  (ICD-099.3) 10)  Arthritis, Rheumatoid, Juvenile  (ICD-714.30) 11)  Mitral Valve Prolapse, Mild With H/o Pat  (ICD-424.0) 12)  Hypertension  (ICD-401.9) 13)  Gerd  (ICD-530.81)  Medications Prior to Update: 1)  Icaps .... Take One By Mouth Daily 2)  Diovan Hct 160-25 Mg  Tabs (Valsartan-Hydrochlorothiazide) .... Take One By Mouth Daily  Allergies: 1)  ! Demerol (Meperidine Hcl)  Past History:  Past Medical History: Last updated: 08/28/2006 GERD Hypertension  Past Surgical History: Last updated: 06/19/2009 Bilateral knee  contractures as a child JRA, 17yoa  HLA B27+ Appendectomy  14yoa Iritis    Prednisone, ASA, NSAIDS, Phenylbutisone  Flare as Cief Resident 1983 ECHO- EF 55%, Thallium wnl 11/91 Macular Degen Fourescein Angiog (Dr Luciana Axe)  1996 ETT with Juanito Doom 12/98 Colonoscopy- Medoff, polypectomy  11/24/98 EGD- esophgitis with stricture, HH, gastritis 11/24/98 CATH, min dz, mild LAD EF 72% 12/16/03 Colonoscopy- wnl- no polyps 2004  repeat 09 EGD- wnl, bx negative, H.Pylori negative (Dr Mechele Collin) 05/19/04 Colonoscopy Polyp Int Hemms Divertics throughout (Dr Mechele Collin) 07/16/08   5 yrs(suggested 3 yrs) EGD Esophagitis poss [facility] Gastritis (Dr Mechele Collin) 06/14/2009  Family History: Last updated: 12/14/2009 Father: A 80  one-vessel coronary artery disease HTN  Lipitor Mother: dec died from intracerebral hemm on Coumadin with hypertension, bypass graft, a stroke from atrial fibrillation, also on Lipitor. She continues to smoke and has had leg claudication.  Siblings: One younger brother by 7 years mild cardiac disease  Intermmittent SVT Diabetes- MGM. Intestional polyps in a GM at 60 years of age  Social History: Last updated: 08/28/2006 Marital Status: Married Children: One son and daughter Occupation: Careers adviser at [employer]  Risk Factors: Alcohol Use: 2 (12/14/2009) Caffeine Use: 3 (12/14/2009) Exercise: yes (12/14/2009)  Risk Factors: Smoking Status: never (12/14/2009) Passive Smoke Exposure: no (12/14/2009)  Family History: Father: A 80  one-vessel coronary  artery disease HTN  Lipitor Mother: dec died from intracerebral hemm on Coumadin with hypertension, bypass graft, a stroke from atrial fibrillation, also on Lipitor. She continues to smoke and has had leg claudication.  Siblings: One younger brother by 7 years mild cardiac disease  Intermmittent SVT Diabetes- MGM. Intestional polyps in a GM at 60 years of age  Review of Systems General:  Denies chills, fatigue,  fever, sweats, weakness, and weight loss. Eyes:  Complains of blurring; denies discharge, eye pain, and itching. ENT:  Complains of ringing in ears; denies decreased hearing, ear discharge, and earache; Sees Dr Suan Halter. CV:  Denies chest pain or discomfort, fainting, fatigue, palpitations, shortness of breath with exertion, swelling of feet, and swelling of hands. Resp:  Denies cough, shortness of breath, and wheezing. GI:  Complains of indigestion; denies abdominal pain, bloody stools, change in bowel habits, constipation, dark tarry stools, diarrhea, loss of appetite, nausea, vomiting, vomiting blood, and yellowish skin color; occas indigestion, typically carb related.. GU:  Complains of urinary hesitancy; denies dysuria, hematuria, incontinence, nocturia, and urinary frequency. MS:  Complains of joint pain; denies low back pain, muscle aches, cramps, muscle weakness, and stiffness; H/o juvenile RA. Derm:  Denies dryness, itching, and rash. Neuro:  Complains of poor balance; denies numbness, tingling, and tremors; has some difficulty when in decreased visual cues such as walking on the dark..  Physical Exam  General:  Well-developed,well-nourished,in no acute distress; alert,appropriate and cooperative throughout examination Head:  Normocephalic and atraumatic without obvious abnormalities. No apparent alopecia or balding. Eyes:  Conjunctiva clear bilaterally.  Ears:  External ear exam shows no significant lesions or deformities.  Otoscopic examination reveals clear canals, tympanic membranes are intact bilaterally without bulging, retraction, inflammation or discharge. Hearing is grossly normal bilaterally. Nose:  External nasal examination shows no deformity or inflammation. Nasal mucosa are pink and moist without lesions or exudates. Septal defect due to high ASA as a child for JRA Mouth:  Oral mucosa and oropharynx without lesions or exudates.  Teeth in good repair. Neck:  No  deformities, masses, or tenderness noted. Chest Wall:  No deformities, masses, tenderness or gynecomastia noted. Breasts:  No masses or gynecomastia noted Lungs:  Normal respiratory effort, chest expands symmetrically. Lungs are clear to auscultation, no crackles or wheezes. Heart:  Normal rate and regular rhythm. S1 and S2 normal without gallop, murmur, click, rub or other extra sounds. Abdomen:  Bowel sounds positive,abdomen soft and non-tender without masses, organomegaly or hernias noted. Liver edge felt on deep palpation, still 8cm MCL. Rectal:  No external abnormalities noted. Normal sphincter tone. No rectal masses or tenderness. G neg. Genitalia:  Testes bilaterally descended without nodularity, tenderness or masses. No scrotal masses or lesions. No penis lesions or urethral discharge. Prostate:  Prostate gland firm and smooth, no enlargement, nodularity, tenderness, mass, minimal asymmetry with left pole slightly larger than right. No palpable  induration. 20gms. Msk:  No deformity or scoliosis noted of thoracic or lumbar spine.   Pulses:  R and L carotid,radial,femoral,dorsalis pedis and posterior tibial pulses are full and equal bilaterally Extremities:  No clubbing, cyanosis, edema, or deformity noted with normal full range of motion of all joints.  Minimal evidence of arthropathy grossly. Neurologic:  No cranial nerve deficits noted. Station and gait are normal. Sensory, motor and coordinative functions appear intact. Skin:  Intact without suspicious lesions or rashes except 1 cm hyperpigmented lesion just left side of manubrial area of superior sternum, unchaged. Multiple AKs. Cervical Nodes:  No lymphadenopathy noted Inguinal Nodes:  No significant adenopathy Psych:  Cognition and judgment appear intact. Alert and cooperative with normal attention span and concentration. No apparent delusions, illusions, hallucinations   Impression & Recommendations:  Problem # 1:  HEALTH  MAINTENANCE EXAM (ICD-V70.0) Assessment Comment Only  Reviewed preventive care protocols, scheduled due services, and updated immunizations.  Problem # 2:  ESOPHAGITIS (ICD-530.10) Assessment: Improved Doing better with diet control and past meds.  Problem # 3:  GASTRITIS (ICD-535.50) Assessment: Improved Resolved. His updated medication list for this problem includes:    Prilosec Otc 20 Mg Tbec (Omeprazole magnesium) .Marland Kitchen... Take one by mouth daily  Problem # 4:  COLONIC POLYPS, ADENOMATOUS (ICD-211.3) Assessment: Unchanged Colonoscopy UTD.  Problem # 5:  HYPERCHOLESTEROLEMIA, 160/ HDL 36 /LDL 108 (ICD-272.0) Assessment: Unchanged Adequate except LDL.Marland Kitchenwill work on diet to avoid fatty foods as he had been on Low Carb.  Problem # 6:  HYPERTENSION (ICD-401.9) Assessment: Unchanged Stable. His updated medication list for this problem includes:    Diovan Hct 160-25 Mg Tabs (Valsartan-hydrochlorothiazide) .Marland Kitchen... Take one by mouth daily  BP today: 118/78 Prior BP: 112/70 (11/24/2008)  Problem # 7:  GERD (ICD-530.81) Assessment: Improved Significantly improved with meds and lifestyle changes. Cont. His updated medication list for this problem includes:    Prilosec Otc 20 Mg Tbec (Omeprazole magnesium) .Marland Kitchen... Take one by mouth daily  Diagnostics Reviewed:  Discussed lifestyle modifications, diet, antacids/medications, and preventive measures. Handout provided.   Complete Medication List: 1)  Icaps  .... Take one by mouth daily 2)  Diovan Hct 160-25 Mg Tabs (Valsartan-hydrochlorothiazide) .... Take one by mouth daily 3)  Prilosec Otc 20 Mg Tbec (Omeprazole magnesium) .... Take one by mouth daily 4)  Iron Supplement 325 (65 Fe) Mg Tabs (Ferrous sulfate) .... Take one by mouth qod  Patient Instructions: 1)  RTC one year, sooner as needed.   Orders Added: 1)  Est. Patient 40-64 years [[id number]]   Immunization History:  Influenza Immunization History:    Influenza:  historical  (11/30/2009)   Immunization History:  Influenza Immunization History:    Influenza:  Historical (11/30/2009)  Current Allergies (reviewed today): ! DEMEROL (MEPERIDINE HCL)

## 2010-03-20 NOTE — Letter (Signed)
Summary: Note from patient-re: Blood Pressure  Note from patient-re: Blood Pressure   Imported By: Beau Fanny 09/09/2007 08:40:25  _____________________________________________________________________  External Attachment:    Type:   Image     Comment:   External Document

## 2010-03-20 NOTE — Progress Notes (Signed)
Summary: STILL HAS COUGH  Phone Note Call from Patient Message from:  Patient on February 15, 2008 10:20 AM  Caller: Patient//OFFICE NURSE Call For: DR. SCHALLER Summary of Call: PATIENT IS OUT OF TOWN WILL BE HOME TONIGHT // STILL HAS COUGH NO FEVER NO PAIN COUGH X 3 WEEKS OFFICE PERSON  [phone] Initial call taken by: Providence Crosby,  February 15, 2008 10:21 AM  Follow-up for Phone Call        Take Zithromax. Cont  Guaifenesin by going to [facility], [facility] or [facility] and getting MUCOUS RELIEF EXPECTORANT (400mg ), take 11/2 tabs by mouth AM and NOON if having ANY production.  Drink lots of fluids anytime taking Guaifenesin.  Take Tessalon 200mg  three times a day. Take Tussionex at night.  Additional Follow-up for Phone Call Additional follow up Details #1::        NURSE NOTIFIED AND TUSSINEX CALLED TO  [facility] Additional Follow-up by: Providence Crosby,  February 15, 2008 2:14 PM    New/Updated Medications: ZITHROMAX TRI-PAK 500 MG TABS (AZITHROMYCIN) as dir TESSALON 200 MG CAPS (BENZONATATE) one cap by mouth three times a day as needed cough TUSSIONEX PENNKINETIC ER 8-10 MG/5ML LQCR (CHLORPHENIRAMINE-HYDROCODONE) one tsp by mouth at night as needed cough.   Prescriptions: TUSSIONEX PENNKINETIC ER 8-10 MG/5ML LQCR (CHLORPHENIRAMINE-HYDROCODONE) one tsp by mouth at night as needed cough.  #8 oz x 0   Entered and Authorized by:   Shaune Leeks MD   Signed by:   Shaune Leeks MD on 02/15/2008   Method used:   Telephoned to ...       [facility] [address]* (retail)       [address] [place], [place]  [address]       Ph: [phone]       Fax: [phone]   RxID:   [id number] TESSALON 200 MG CAPS (BENZONATATE) one cap by mouth three times a day as needed cough  #30 x 1   Entered and Authorized by:   Shaune Leeks MD   Signed by:   Shaune Leeks MD on 02/15/2008   Method used:    Electronically to        [facility] [address] (retail)       [address] [place], [place]  [address]       Ph: [phone]       Fax: [phone]   RxID:   [id number] Christena Deem TRI-PAK 500 MG TABS (AZITHROMYCIN) as dir  #1 Pak x 0   Entered and Authorized by:   Shaune Leeks MD   Signed by:   Shaune Leeks MD on 02/15/2008   Method used:   Electronically to        [facility] [address]* (retail)       [address] [place], [place]  [address]       Ph: [phone]       Fax: [phone]   RxID:   [id number]  Please call in Tussionex. Come in if sxs don't improve.

## 2010-07-06 ENCOUNTER — Other Ambulatory Visit: Payer: Self-pay | Admitting: *Deleted

## 2010-07-06 MED ORDER — VALSARTAN-HYDROCHLOROTHIAZIDE 160-25 MG PO TABS: 1.0000 | ORAL_TABLET | Freq: Every day | ORAL | Status: DC

## 2010-08-23 ENCOUNTER — Telehealth: Payer: Self-pay | Admitting: *Deleted

## 2010-08-23 NOTE — Telephone Encounter (Signed)
Patient is asking that you call him at your convenience. His pager number is [phone].

## 2010-08-23 NOTE — Telephone Encounter (Signed)
Was bitten by multiple ticks, most he has gotten off quickly but has had myopathy and fatigue the last few days to a week and started Doxy yesterday. No rash, no overt joint aches. (we discussed incidence)  Feels slightly better, admits poss placebo but poss not. Does he need lab test. My opinion is no, take the ten days of Doxy and think about it if no response.

## 2010-12-18 ENCOUNTER — Encounter: Payer: Self-pay | Admitting: Family Medicine

## 2010-12-20 ENCOUNTER — Ambulatory Visit (INDEPENDENT_AMBULATORY_CARE_PROVIDER_SITE_OTHER): Payer: 59 | Admitting: Family Medicine

## 2010-12-20 ENCOUNTER — Encounter: Payer: Self-pay | Admitting: Family Medicine

## 2010-12-20 DIAGNOSIS — D126 Benign neoplasm of colon, unspecified: Secondary | ICD-10-CM

## 2010-12-20 DIAGNOSIS — I10 Essential (primary) hypertension: Secondary | ICD-10-CM

## 2010-12-20 DIAGNOSIS — M083 Juvenile rheumatoid polyarthritis (seronegative): Secondary | ICD-10-CM

## 2010-12-20 DIAGNOSIS — K921 Melena: Secondary | ICD-10-CM

## 2010-12-20 DIAGNOSIS — E781 Pure hyperglyceridemia: Secondary | ICD-10-CM

## 2010-12-20 DIAGNOSIS — K209 Esophagitis, unspecified without bleeding: Secondary | ICD-10-CM

## 2010-12-20 DIAGNOSIS — K219 Gastro-esophageal reflux disease without esophagitis: Secondary | ICD-10-CM

## 2010-12-20 MED ORDER — VALSARTAN-HYDROCHLOROTHIAZIDE 160-25 MG PO TABS: 1.0000 | ORAL_TABLET | Freq: Every day | ORAL | Status: DC

## 2010-12-20 NOTE — Assessment & Plan Note (Signed)
Self controlled. Septal nasal defect now epithelialized (from ASA use as a child). Discussed Glucosamine/ Chondroitin/Hyaluronic Acid for the knees.

## 2010-12-20 NOTE — Assessment & Plan Note (Signed)
Resolved

## 2010-12-20 NOTE — Assessment & Plan Note (Signed)
Trigs slightly elevated, HDL minimally low and LDL acceptable altho always try to get lower.  Declines meds with which I agree. Discussed diet.

## 2010-12-20 NOTE — Assessment & Plan Note (Signed)
Good control on current medication. Cont Diovan-Hct. BP Readings from Last 3 Encounters:  12/20/10 128/82  12/14/09 118/78  11/24/08 112/70

## 2010-12-20 NOTE — Patient Instructions (Signed)
Try Glucosamine/Chondroitin/Hyasluronic Acid for the knees as needed. RTC one year, sooner prn.

## 2010-12-20 NOTE — Assessment & Plan Note (Signed)
Colonoscopy UTD. Sees Dr Mechele Collin in [place]. Cont as is.

## 2010-12-20 NOTE — Assessment & Plan Note (Signed)
Doinf well on regular PPI. Cont.

## 2010-12-20 NOTE — Progress Notes (Signed)
  Subjective:    Patient ID: Jonathan Dixon, male    DOB: [birth year], 60 y.o.   MRN: [id number]  HPI Pt here for Comp Exam.  He has no complaints and feels well. His labs are worse and he overdid things on hunting trip.     Review of Systems  Constitutional: Negative for fever, chills, diaphoresis, appetite change, fatigue and unexpected weight change.  HENT: Positive for tinnitus (chronic saw Dr Elenore Rota.). Negative for hearing loss, ear pain and ear discharge.   Eyes: Negative for pain, discharge and visual disturbance.       See Opthalm regularly.  Respiratory: Negative for cough, shortness of breath and wheezing.   Cardiovascular: Negative for chest pain and palpitations.       No SOB w/ exertion  Gastrointestinal: Negative for nausea, vomiting, abdominal pain, diarrhea, constipation and blood in stool.       Some heartburn but no swallowing problems.  Genitourinary: Negative for dysuria, frequency and difficulty urinating.       No nocturia  Musculoskeletal: Positive for arthralgias. Negative for myalgias and back pain.  Skin: Negative for rash.       No itching or dryness.  Neurological: Negative for tremors and numbness.       No tingling but  mild  balance problems.  Hematological: Negative for adenopathy. Does not bruise/bleed easily.  Psychiatric/Behavioral: Negative for dysphoric mood and agitation.       Objective:   Physical Exam  Constitutional: He is oriented to person, place, and time. He appears well-developed and well-nourished. No distress.  HENT:  Head: Normocephalic and atraumatic.  Right Ear: External ear normal.  Left Ear: External ear normal.  Nose: Nose normal.  Mouth/Throat: Oropharynx is clear and moist.  Eyes: Conjunctivae and EOM are normal. Pupils are equal, round, and reactive to light. Right eye exhibits no discharge. Left eye exhibits no discharge. No scleral icterus.  Neck: Normal range of motion. Neck supple. No thyromegaly present.    Cardiovascular: Normal rate, regular rhythm, normal heart sounds and intact distal pulses.   No murmur heard. Pulmonary/Chest: Effort normal and breath sounds normal. No respiratory distress. He has no wheezes.  Abdominal: Soft. Bowel sounds are normal. He exhibits no distension and no mass. There is no tenderness. There is no rebound and no guarding.  Genitourinary: Penis normal.       No hernias felt.  Musculoskeletal: Normal range of motion. He exhibits no edema.  Lymphadenopathy:    He has no cervical adenopathy.  Neurological: He is alert and oriented to person, place, and time. Coordination normal.  Skin: Skin is warm and dry. No rash noted. He is not diaphoretic.  Psychiatric: He has a normal mood and affect. His behavior is normal. Judgment and thought content normal.          Assessment & Plan:  HMPE

## 2010-12-20 NOTE — Assessment & Plan Note (Signed)
Assx on PPI, cont. Has tried to decrease w/ return of sxs.

## 2011-04-30 ENCOUNTER — Other Ambulatory Visit: Payer: Self-pay | Admitting: *Deleted

## 2011-06-30 ENCOUNTER — Other Ambulatory Visit: Payer: Self-pay | Admitting: Family Medicine

## 2011-08-02 ENCOUNTER — Encounter: Payer: Self-pay | Admitting: Family Medicine

## 2011-08-02 ENCOUNTER — Ambulatory Visit (INDEPENDENT_AMBULATORY_CARE_PROVIDER_SITE_OTHER): Payer: 59 | Admitting: Family Medicine

## 2011-08-02 VITALS — BP 124/88 | HR 68 | Temp 97.5°F | Wt 204.0 lb

## 2011-08-02 DIAGNOSIS — I10 Essential (primary) hypertension: Secondary | ICD-10-CM

## 2011-08-02 NOTE — Progress Notes (Signed)
Hypertension:    Using medication without problems or lightheadedness: yes Chest pain with exertion: no Edema:no Short of breath:no Average home BPs: gradually increasing, checked mult times.  Had been as high as 170s/100s on checks at work.   Meds, vitals, and allergies reviewed.   PMH and SH reviewed  ROS: See HPI.  Otherwise negative.    GEN: nad, alert and oriented HEENT: mucous membranes moist NECK: supple w/o LA CV: rrr. PULM: ctab, no inc wob ABD: soft, +bs EXT: no edema SKIN: no acute rash

## 2011-08-02 NOTE — Patient Instructions (Addendum)
We'll contact you with your lab report. Take care.    

## 2011-08-03 LAB — BASIC METABOLIC PANEL
BUN/Creatinine Ratio: 23 — ABNORMAL HIGH (ref 9–20)
BUN: 19 mg/dL (ref 6–24)
CO2: 21 mmol/L (ref 20–32)
Calcium: 9.7 mg/dL (ref 8.7–10.2)
Chloride: 103 mmol/L (ref 97–108)
Creatinine, Ser: 0.82 mg/dL (ref 0.76–1.27)
GFR calc Af Amer: 113 mL/min/{1.73_m2} (ref >59)
GFR calc non Af Amer: 97 mL/min/{1.73_m2} (ref >59)
Glucose: 87 mg/dL (ref 65–99)
Potassium: 3.8 mmol/L (ref 3.5–5.2)
Sodium: 142 mmol/L (ref 134–144)

## 2011-08-04 NOTE — Assessment & Plan Note (Signed)
With recheck BP here today- 132/86. I wouldn't change meds at this point.  I don't suspect cuff error here.  I would have patient check BP a few times at home- early AM, sitting, after urination, and then notify us of the results.  If persistently elevated, it would be reasonable to intervene. I don't want to induce hypotension.

## 2011-08-05 ENCOUNTER — Encounter: Payer: Self-pay | Admitting: *Deleted

## 2011-09-24 ENCOUNTER — Ambulatory Visit: Payer: Self-pay | Admitting: Ophthalmology

## 2011-10-01 ENCOUNTER — Ambulatory Visit: Payer: Self-pay | Admitting: Ophthalmology

## 2012-06-06 ENCOUNTER — Other Ambulatory Visit: Payer: Self-pay | Admitting: Family Medicine

## 2012-07-06 ENCOUNTER — Ambulatory Visit: Payer: Self-pay | Admitting: Specialist

## 2012-07-07 ENCOUNTER — Encounter: Payer: Self-pay | Admitting: Family Medicine

## 2012-07-07 ENCOUNTER — Other Ambulatory Visit: Payer: Self-pay | Admitting: Neurosurgery

## 2012-07-08 ENCOUNTER — Ambulatory Visit: Payer: Self-pay | Admitting: General Surgery

## 2012-07-08 LAB — COMPREHENSIVE METABOLIC PANEL
Albumin: 4.1 g/dL (ref 3.4–5.0)
Alkaline Phosphatase: 78 U/L (ref 50–136)
Anion Gap: 6 — ABNORMAL LOW (ref 7–16)
BUN: 20 mg/dL — ABNORMAL HIGH (ref 7–18)
Bilirubin,Total: 0.5 mg/dL (ref 0.2–1.0)
Calcium, Total: 9.5 mg/dL (ref 8.5–10.1)
Chloride: 105 mmol/L (ref 98–107)
Co2: 29 mmol/L (ref 21–32)
Creatinine: 0.78 mg/dL (ref 0.60–1.30)
EGFR (African American): >60
EGFR (Non-African Amer.): >60
Glucose: 101 mg/dL — ABNORMAL HIGH (ref 65–99)
Osmolality: 282 (ref 275–301)
Potassium: 3.9 mmol/L (ref 3.5–5.1)
SGOT(AST): 27 U/L (ref 15–37)
SGPT (ALT): 40 U/L (ref 12–78)
Sodium: 140 mmol/L (ref 136–145)
Total Protein: 7.9 g/dL (ref 6.4–8.2)

## 2012-07-08 LAB — CBC WITH DIFFERENTIAL/PLATELET
Basophil #: 0.1 10*3/uL (ref 0.0–0.1)
Basophil %: 0.7 %
Eosinophil #: 0 10*3/uL (ref 0.0–0.7)
Eosinophil %: 0.1 %
HCT: 48.3 % (ref 40.0–52.0)
HGB: 16.4 g/dL (ref 13.0–18.0)
Lymphocyte #: 1.8 10*3/uL (ref 1.0–3.6)
Lymphocyte %: 11.2 %
MCH: 31.1 pg (ref 26.0–34.0)
MCHC: 33.9 g/dL (ref 32.0–36.0)
MCV: 92 fL (ref 80–100)
Monocyte #: 0.7 x10 3/mm (ref 0.2–1.0)
Monocyte %: 4.5 %
Neutrophil #: 13.1 10*3/uL — ABNORMAL HIGH (ref 1.4–6.5)
Neutrophil %: 83.5 %
Platelet: 315 10*3/uL (ref 150–440)
RBC: 5.27 10*6/uL (ref 4.40–5.90)
RDW: 12.9 % (ref 11.5–14.5)
WBC: 15.7 10*3/uL — ABNORMAL HIGH (ref 3.8–10.6)

## 2012-07-08 NOTE — Progress Notes (Signed)
Spoke with pt's wife he is an MD at [facility] will have CBC, CMET, EKG, CXR completed today and faxed to short stay today.

## 2012-07-09 ENCOUNTER — Encounter (HOSPITAL_COMMUNITY): Payer: Self-pay | Admitting: *Deleted

## 2012-07-09 MED ORDER — DEXAMETHASONE SODIUM PHOSPHATE 10 MG/ML IJ SOLN
10.0000 mg | INTRAMUSCULAR | Status: AC
  Administered 2012-07-10: 10 mg via INTRAVENOUS
  Filled 2012-07-09: qty 1

## 2012-07-09 MED ORDER — CEFAZOLIN SODIUM-DEXTROSE 2-3 GM-% IV SOLR
2.0000 g | INTRAVENOUS | Status: AC
  Administered 2012-07-10: 2 g via INTRAVENOUS
  Filled 2012-07-09: qty 50

## 2012-07-10 ENCOUNTER — Encounter (HOSPITAL_COMMUNITY)
Admission: RE | Disposition: A | Discharge status: Home / Self Care | Payer: Self-pay | Source: Ambulatory Visit | Attending: Neurosurgery

## 2012-07-10 ENCOUNTER — Encounter (HOSPITAL_COMMUNITY): Payer: Self-pay | Admitting: *Deleted

## 2012-07-10 ENCOUNTER — Observation Stay (HOSPITAL_COMMUNITY)
Admission: RE | Admit: 2012-07-10 | Discharge: 2012-07-10 | Disposition: A | Discharge status: Home / Self Care | Payer: 59 | Source: Ambulatory Visit | Attending: Neurosurgery | Admitting: Neurosurgery

## 2012-07-10 ENCOUNTER — Encounter (HOSPITAL_COMMUNITY): Payer: Self-pay | Admitting: Anesthesiology

## 2012-07-10 ENCOUNTER — Ambulatory Visit (HOSPITAL_COMMUNITY): Payer: 59 | Admitting: Anesthesiology

## 2012-07-10 ENCOUNTER — Observation Stay (HOSPITAL_COMMUNITY): Payer: 59

## 2012-07-10 DIAGNOSIS — G834 Cauda equina syndrome: Secondary | ICD-10-CM | POA: Insufficient documentation

## 2012-07-10 DIAGNOSIS — M5126 Other intervertebral disc displacement, lumbar region: Principal | ICD-10-CM | POA: Diagnosis present

## 2012-07-10 DIAGNOSIS — I10 Essential (primary) hypertension: Secondary | ICD-10-CM | POA: Insufficient documentation

## 2012-07-10 HISTORY — DX: Malignant (primary) neoplasm, unspecified: C80.1

## 2012-07-10 HISTORY — PX: LUMBAR LAMINECTOMY/DECOMPRESSION MICRODISCECTOMY: SHX5026

## 2012-07-10 HISTORY — DX: Personal history of other infectious and parasitic diseases: Z86.19

## 2012-07-10 LAB — SURGICAL PCR SCREEN
MRSA, PCR: NEGATIVE
Staphylococcus aureus: NEGATIVE

## 2012-07-10 SURGERY — LUMBAR LAMINECTOMY/DECOMPRESSION MICRODISCECTOMY 1 LEVEL
Anesthesia: General | Site: Back | Laterality: Left | Wound class: Clean

## 2012-07-10 MED ORDER — KETOROLAC TROMETHAMINE 30 MG/ML IJ SOLN: 30.0000 mg | Freq: Four times a day (QID) | INTRAMUSCULAR | Status: DC

## 2012-07-10 MED ORDER — LACTATED RINGERS IV SOLN
INTRAVENOUS | Status: DC | PRN
  Administered 2012-07-10 (×3): via INTRAVENOUS

## 2012-07-10 MED ORDER — ACETAMINOPHEN 325 MG PO TABS: 650.0000 mg | ORAL_TABLET | ORAL | Status: DC | PRN

## 2012-07-10 MED ORDER — LIDOCAINE HCL (CARDIAC) 20 MG/ML IV SOLN
INTRAVENOUS | Status: DC | PRN
  Administered 2012-07-10: 70 mg via INTRAVENOUS

## 2012-07-10 MED ORDER — ONDANSETRON HCL 4 MG/2ML IJ SOLN: 4.0000 mg | INTRAMUSCULAR | Status: DC | PRN

## 2012-07-10 MED ORDER — EPHEDRINE SULFATE 50 MG/ML IJ SOLN
INTRAMUSCULAR | Status: DC | PRN
  Administered 2012-07-10: 5 mg via INTRAVENOUS
  Administered 2012-07-10 (×2): 10 mg via INTRAVENOUS

## 2012-07-10 MED ORDER — HYDROCHLOROTHIAZIDE 12.5 MG PO CAPS
12.5000 mg | ORAL_CAPSULE | Freq: Every day | ORAL | Status: DC
  Filled 2012-07-10: qty 1

## 2012-07-10 MED ORDER — BUPIVACAINE HCL (PF) 0.25 % IJ SOLN
INTRAMUSCULAR | Status: DC | PRN
  Administered 2012-07-10: 20 mL

## 2012-07-10 MED ORDER — PANTOPRAZOLE SODIUM 20 MG PO TBEC
20.0000 mg | DELAYED_RELEASE_TABLET | Freq: Every day | ORAL | Status: DC
  Filled 2012-07-10: qty 1

## 2012-07-10 MED ORDER — SODIUM CHLORIDE 0.9 % IJ SOLN: 3.0000 mL | INTRAMUSCULAR | Status: DC | PRN

## 2012-07-10 MED ORDER — IRBESARTAN 75 MG PO TABS
75.0000 mg | ORAL_TABLET | Freq: Every day | ORAL | Status: DC
  Filled 2012-07-10: qty 1

## 2012-07-10 MED ORDER — OXYCODONE HCL 5 MG/5ML PO SOLN: 5.0000 mg | Freq: Once | ORAL | Status: DC | PRN

## 2012-07-10 MED ORDER — SODIUM CHLORIDE 0.9 % IR SOLN
Status: DC | PRN
  Administered 2012-07-10: 08:00:00

## 2012-07-10 MED ORDER — SENNA 8.6 MG PO TABS
1.0000 | ORAL_TABLET | Freq: Two times a day (BID) | ORAL | Status: DC
  Filled 2012-07-10: qty 1

## 2012-07-10 MED ORDER — CEFAZOLIN SODIUM 1-5 GM-% IV SOLN
1.0000 g | Freq: Three times a day (TID) | INTRAVENOUS | Status: DC
  Filled 2012-07-10: qty 50

## 2012-07-10 MED ORDER — CYCLOBENZAPRINE HCL 10 MG PO TABS: 10.0000 mg | ORAL_TABLET | Freq: Three times a day (TID) | ORAL | Status: DC | PRN

## 2012-07-10 MED ORDER — ONDANSETRON HCL 4 MG/2ML IJ SOLN
INTRAMUSCULAR | Status: DC | PRN
  Administered 2012-07-10: 4 mg via INTRAVENOUS

## 2012-07-10 MED ORDER — MENTHOL 3 MG MT LOZG: 1.0000 | LOZENGE | OROMUCOSAL | Status: DC | PRN

## 2012-07-10 MED ORDER — PHENOL 1.4 % MT LIQD: 1.0000 | OROMUCOSAL | Status: DC | PRN

## 2012-07-10 MED ORDER — DEXTROSE 5 % IV SOLN
INTRAVENOUS | Status: DC | PRN
  Administered 2012-07-10: 08:00:00 via INTRAVENOUS

## 2012-07-10 MED ORDER — MUPIROCIN 2 % EX OINT
TOPICAL_OINTMENT | Freq: Two times a day (BID) | CUTANEOUS | Status: DC
  Filled 2012-07-10: qty 22

## 2012-07-10 MED ORDER — MUPIROCIN 2 % EX OINT
TOPICAL_OINTMENT | CUTANEOUS | Status: AC
  Filled 2012-07-10: qty 22

## 2012-07-10 MED ORDER — HYDROMORPHONE HCL PF 1 MG/ML IJ SOLN: 0.5000 mg | INTRAMUSCULAR | Status: DC | PRN

## 2012-07-10 MED ORDER — HYDROMORPHONE HCL PF 1 MG/ML IJ SOLN: 0.2500 mg | INTRAMUSCULAR | Status: DC | PRN

## 2012-07-10 MED ORDER — HYDROCODONE-ACETAMINOPHEN 5-325 MG PO TABS: 1.0000 | ORAL_TABLET | ORAL | Status: DC | PRN

## 2012-07-10 MED ORDER — OXYCODONE-ACETAMINOPHEN 5-325 MG PO TABS: 1.0000 | ORAL_TABLET | ORAL | Status: DC | PRN

## 2012-07-10 MED ORDER — NEOSTIGMINE METHYLSULFATE 1 MG/ML IJ SOLN
INTRAMUSCULAR | Status: DC | PRN
  Administered 2012-07-10: 5 mg via INTRAVENOUS

## 2012-07-10 MED ORDER — ZOLPIDEM TARTRATE 5 MG PO TABS: 5.0000 mg | ORAL_TABLET | Freq: Every evening | ORAL | Status: DC | PRN

## 2012-07-10 MED ORDER — OXYCODONE HCL 5 MG PO TABS: 5.0000 mg | ORAL_TABLET | Freq: Once | ORAL | Status: DC | PRN

## 2012-07-10 MED ORDER — THROMBIN 5000 UNITS EX SOLR
CUTANEOUS | Status: DC | PRN
  Administered 2012-07-10 (×2): 5000 [IU] via TOPICAL

## 2012-07-10 MED ORDER — ARTIFICIAL TEARS OP OINT
TOPICAL_OINTMENT | OPHTHALMIC | Status: DC | PRN
  Administered 2012-07-10: 1 via OPHTHALMIC

## 2012-07-10 MED ORDER — ONDANSETRON HCL 4 MG/2ML IJ SOLN: 4.0000 mg | Freq: Four times a day (QID) | INTRAMUSCULAR | Status: DC | PRN

## 2012-07-10 MED ORDER — ACETAMINOPHEN 650 MG RE SUPP: 650.0000 mg | RECTAL | Status: DC | PRN

## 2012-07-10 MED ORDER — GLYCOPYRROLATE 0.2 MG/ML IJ SOLN
INTRAMUSCULAR | Status: DC | PRN
  Administered 2012-07-10: .8 mg via INTRAVENOUS

## 2012-07-10 MED ORDER — KETOROLAC TROMETHAMINE 30 MG/ML IJ SOLN
INTRAMUSCULAR | Status: DC | PRN
  Administered 2012-07-10: 30 mg via INTRAVENOUS

## 2012-07-10 MED ORDER — BACITRACIN 50000 UNITS IM SOLR
INTRAMUSCULAR | Status: AC
  Filled 2012-07-10: qty 1

## 2012-07-10 MED ORDER — ROCURONIUM BROMIDE 100 MG/10ML IV SOLN
INTRAVENOUS | Status: DC | PRN
  Administered 2012-07-10: 50 mg via INTRAVENOUS
  Administered 2012-07-10: 20 mg via INTRAVENOUS

## 2012-07-10 MED ORDER — HEMOSTATIC AGENTS (NO CHARGE) OPTIME
TOPICAL | Status: DC | PRN
  Administered 2012-07-10: 1 via TOPICAL

## 2012-07-10 MED ORDER — PROPOFOL 10 MG/ML IV BOLUS
INTRAVENOUS | Status: DC | PRN
  Administered 2012-07-10: 160 mg via INTRAVENOUS

## 2012-07-10 MED ORDER — CALCIUM CARBONATE ANTACID 500 MG PO CHEW
1.0000 | CHEWABLE_TABLET | Freq: Every day | ORAL | Status: DC | PRN
  Filled 2012-07-10: qty 1

## 2012-07-10 MED ORDER — FENTANYL CITRATE 0.05 MG/ML IJ SOLN
INTRAMUSCULAR | Status: DC | PRN
  Administered 2012-07-10 (×3): 50 ug via INTRAVENOUS

## 2012-07-10 MED ORDER — ADULT MULTIVITAMIN W/MINERALS CH
1.0000 | ORAL_TABLET | Freq: Every day | ORAL | Status: DC
  Filled 2012-07-10: qty 1

## 2012-07-10 MED ORDER — SODIUM CHLORIDE 0.9 % IV SOLN
250.0000 mL | INTRAVENOUS | Status: DC
  Administered 2012-07-10: 13:00:00 via INTRAVENOUS

## 2012-07-10 MED ORDER — VALSARTAN-HYDROCHLOROTHIAZIDE 80-12.5 MG PO TABS
1.0000 | ORAL_TABLET | Freq: Every day | ORAL | Status: DC
Start: 2012-07-10 — End: 2012-07-10

## 2012-07-10 MED ORDER — ALUM & MAG HYDROXIDE-SIMETH 200-200-20 MG/5ML PO SUSP: 30.0000 mL | Freq: Four times a day (QID) | ORAL | Status: DC | PRN

## 2012-07-10 MED ORDER — 0.9 % SODIUM CHLORIDE (POUR BTL) OPTIME
TOPICAL | Status: DC | PRN
  Administered 2012-07-10: 1000 mL

## 2012-07-10 MED ORDER — SODIUM CHLORIDE 0.9 % IV SOLN
INTRAVENOUS | Status: AC
  Filled 2012-07-10: qty 500

## 2012-07-10 MED ORDER — SODIUM CHLORIDE 0.9 % IJ SOLN: 3.0000 mL | Freq: Two times a day (BID) | INTRAMUSCULAR | Status: DC

## 2012-07-10 SURGICAL SUPPLY — 50 items
BAG DECANTER FOR FLEXI CONT (MISCELLANEOUS) ×2 IMPLANT
BENZOIN TINCTURE PRP APPL 2/3 (GAUZE/BANDAGES/DRESSINGS) ×2 IMPLANT
BLADE SURG ROTATE 9660 (MISCELLANEOUS) IMPLANT
BRUSH SCRUB EZ PLAIN DRY (MISCELLANEOUS) ×2 IMPLANT
BUR CUTTER 7.0 ROUND (BURR) ×2 IMPLANT
CANISTER SUCTION 2500CC (MISCELLANEOUS) ×2 IMPLANT
CLOTH BEACON ORANGE TIMEOUT ST (SAFETY) ×2 IMPLANT
CONT SPEC 4OZ CLIKSEAL STRL BL (MISCELLANEOUS) ×2 IMPLANT
DECANTER SPIKE VIAL GLASS SM (MISCELLANEOUS) ×2 IMPLANT
DERMABOND ADVANCED (GAUZE/BANDAGES/DRESSINGS) ×1
DERMABOND ADVANCED .7 DNX12 (GAUZE/BANDAGES/DRESSINGS) ×1 IMPLANT
DRAPE LAPAROTOMY 100X72X124 (DRAPES) ×2 IMPLANT
DRAPE MICROSCOPE LEICA (MISCELLANEOUS) ×2 IMPLANT
DRAPE POUCH INSTRU U-SHP 10X18 (DRAPES) ×2 IMPLANT
DRAPE PROXIMA HALF (DRAPES) ×2 IMPLANT
DRAPE SURG 17X23 STRL (DRAPES) ×4 IMPLANT
DURAPREP 26ML APPLICATOR (WOUND CARE) ×2 IMPLANT
ELECT REM PT RETURN 9FT ADLT (ELECTROSURGICAL) ×2
ELECTRODE REM PT RTRN 9FT ADLT (ELECTROSURGICAL) ×1 IMPLANT
GAUZE SPONGE 4X4 16PLY XRAY LF (GAUZE/BANDAGES/DRESSINGS) IMPLANT
GLOVE BIOGEL M 8.0 STRL (GLOVE) ×2 IMPLANT
GLOVE BIOGEL PI IND STRL 7.0 (GLOVE) ×2 IMPLANT
GLOVE BIOGEL PI INDICATOR 7.0 (GLOVE) ×2
GLOVE ECLIPSE 8.5 STRL (GLOVE) ×2 IMPLANT
GLOVE EXAM NITRILE LRG STRL (GLOVE) IMPLANT
GLOVE EXAM NITRILE MD LF STRL (GLOVE) IMPLANT
GLOVE EXAM NITRILE XL STR (GLOVE) IMPLANT
GLOVE EXAM NITRILE XS STR PU (GLOVE) IMPLANT
GLOVE SURG SS PI 7.0 STRL IVOR (GLOVE) ×4 IMPLANT
GOWN BRE IMP SLV AUR LG STRL (GOWN DISPOSABLE) IMPLANT
GOWN BRE IMP SLV AUR XL STRL (GOWN DISPOSABLE) IMPLANT
GOWN STRL REIN 2XL LVL4 (GOWN DISPOSABLE) IMPLANT
KIT BASIN OR (CUSTOM PROCEDURE TRAY) ×2 IMPLANT
KIT ROOM TURNOVER OR (KITS) ×2 IMPLANT
NEEDLE HYPO 22GX1.5 SAFETY (NEEDLE) ×2 IMPLANT
NEEDLE SPNL 22GX3.5 QUINCKE BK (NEEDLE) ×2 IMPLANT
NS IRRIG 1000ML POUR BTL (IV SOLUTION) ×2 IMPLANT
PACK LAMINECTOMY NEURO (CUSTOM PROCEDURE TRAY) ×2 IMPLANT
PAD ARMBOARD 7.5X6 YLW CONV (MISCELLANEOUS) ×6 IMPLANT
RUBBERBAND STERILE (MISCELLANEOUS) ×4 IMPLANT
SPONGE GAUZE 4X4 12PLY (GAUZE/BANDAGES/DRESSINGS) ×2 IMPLANT
SPONGE SURGIFOAM ABS GEL SZ50 (HEMOSTASIS) ×2 IMPLANT
STRIP CLOSURE SKIN 1/2X4 (GAUZE/BANDAGES/DRESSINGS) ×2 IMPLANT
SUT VIC AB 2-0 CT1 18 (SUTURE) ×2 IMPLANT
SUT VIC AB 3-0 SH 8-18 (SUTURE) ×2 IMPLANT
SYR 20ML ECCENTRIC (SYRINGE) ×2 IMPLANT
TAPE CLOTH SURG 4X10 WHT LF (GAUZE/BANDAGES/DRESSINGS) ×2 IMPLANT
TOWEL OR 17X24 6PK STRL BLUE (TOWEL DISPOSABLE) ×2 IMPLANT
TOWEL OR 17X26 10 PK STRL BLUE (TOWEL DISPOSABLE) ×2 IMPLANT
WATER STERILE IRR 1000ML POUR (IV SOLUTION) ×2 IMPLANT

## 2012-07-10 NOTE — Brief Op Note (Signed)
07/10/2012  9:07 AM  PATIENT:  Jonathan Dixon  62 y.o. male  PRE-OPERATIVE DIAGNOSIS:  LUMBAR RADICULOPATHY  POST-OPERATIVE DIAGNOSIS:  LUMBAR RADICULOPATHY  PROCEDURE:  Procedure(s) with comments: Left lumbar four-five microdiskectomy (Left) - Left lumbar four-five microdiskectomy  SURGEON:  Surgeon(s) and Role:    * Temple Pacini, MD - Primary    * Tia Alert, MD - Assisting  PHYSICIAN ASSISTANT:   ASSISTANTS:    ANESTHESIA:   general  EBL:  Total I/O In: 1050 [I.V.:1050] Out: 325 [Blood:325]  BLOOD ADMINISTERED:none  DRAINS: none   LOCAL MEDICATIONS USED:  MARCAINE     SPECIMEN:  No Specimen  DISPOSITION OF SPECIMEN:  N/A  COUNTS:  YES  TOURNIQUET:  * No tourniquets in log *  DICTATION: .Dragon Dictation  PLAN OF CARE: Admit for overnight observation  PATIENT DISPOSITION:  PACU - hemodynamically stable.   Delay start of Pharmacological VTE agent (>24hrs) due to surgical blood loss or risk of bleeding: yes

## 2012-07-10 NOTE — H&P (Signed)
Jonathan Dixon is an 62 y.o. male.   Chief Complaint: Back and bilateral leg pain left greater than right HPI: 62 year old male with severe back and bilateral lower extremity pain more so on the left side. Workup demonstrates evidence of a very large central disc herniation L4-5 with critical compression the thecal sac and L5 nerve roots bilaterally. Patient's failed conservative management and presents now for left-sided L 45 microdiscectomy in hopes of improving his symptoms.  Past Medical History  Diagnosis Date  . GERD (gastroesophageal reflux disease)   . Hypertension   . Iritis 1983      Prednisone, ASA, NSAIDS, phenylbutisone flare as chief resident  . History of ETT 1998    With Tom Wall  . Detached retina   . Cancer     melanoma to face  . History of staph infection     Past Surgical History  Procedure Laterality Date  . Bilateral knee contractures  as a child  . Jra  17 yoa    B27+  . Appendectomy  14 yoa  . Esophagogastroduodenoscopy  11/24/98    esophgitis with stricture, HH, gastritis  . Coronary angioplasty  12/16/03    min dz, mild LAD EF 72%  . Esophagogastroduodenoscopy  06/14/2009    poss Barrett's, HH, gastritis (Dr. Mechele Collin)  . Cataract extraction w/ intraocular lens  implant, bilateral      2013  . Eye surgery      ret detach 2005    Family History  Problem Relation Age of Onset  . Hypertension Mother   . Heart disease Mother     by pass graft, on Lipitor  . Stroke Mother     from atrial fibrilation  . Hypertension Father   . Heart disease Brother     mild cardiac disease, intermmittent SVT  . Diabetes Maternal Grandmother   . Colon cancer Neg Hx   . Prostate cancer Neg Hx    Social History:  reports that he has never smoked. He has never used smokeless tobacco. He reports that  drinks alcohol. He reports that he does not use illicit drugs.  Allergies:  Allergies  Allergen Reactions  . Meperidine Hcl Other (See Comments)    REACTION:  Dyspharia, hypotension    Medications Prior to Admission  Medication Sig Dispense Refill  . calcium carbonate (TUMS - DOSED IN MG ELEMENTAL CALCIUM) 500 MG chewable tablet Chew 1 tablet by mouth daily as needed for heartburn.      Marland Kitchen FERROUS GLUCONATE PO Take 1 tablet by mouth once a week. Takes on Saturdays.      . Lansoprazole (PREVACID PO) Take 1 tablet by mouth daily.      . Multiple Vitamin (MULTIVITAMIN WITH MINERALS) TABS Take 1 tablet by mouth daily.      . valsartan-hydrochlorothiazide (DIOVAN-HCT) 80-12.5 MG per tablet Take 1 tablet by mouth daily.      . methylPREDNISolone (MEDROL DOSEPAK) 4 MG tablet Take 4-24 mg by mouth daily. Follow package directions.  Took for 6 days.  Final dose taken 07/09/2012.        No results found for this or any previous visit (from the past 48 hour(s)). No results found.  Review of Systems  Constitutional: Negative.   HENT: Negative.   Eyes: Negative.   Respiratory: Negative.   Cardiovascular: Negative.   Gastrointestinal: Negative.   Genitourinary: Negative.   Musculoskeletal: Negative.   Skin: Negative.   Neurological: Negative.   Endo/Heme/Allergies: Negative.   Psychiatric/Behavioral: Negative.  Blood pressure 151/89, pulse 59, temperature 98.9 F (37.2 C), temperature source Oral, resp. rate 18, height 5\' 11"  (1.803 m), weight 89.359 kg (197 lb), SpO2 95.00%. Physical Exam  Constitutional: He is oriented to person, place, and time. He appears well-developed and well-nourished.  HENT:  Head: Normocephalic and atraumatic.  Right Ear: External ear normal.  Left Ear: External ear normal.  Nose: Nose normal.  Mouth/Throat: Oropharynx is clear and moist. No oropharyngeal exudate.  Eyes: Conjunctivae and EOM are normal. Pupils are equal, round, and reactive to light. Right eye exhibits no discharge. Left eye exhibits no discharge.  Neck: Normal range of motion. Neck supple. No tracheal deviation present. No thyromegaly present.   Cardiovascular: Normal rate, regular rhythm, normal heart sounds and intact distal pulses.  Exam reveals no friction rub.   No murmur heard. Respiratory: Effort normal and breath sounds normal. No respiratory distress. He has no wheezes.  GI: Soft. Bowel sounds are normal. He exhibits no distension. There is no tenderness.  Musculoskeletal: Normal range of motion. He exhibits no edema and no tenderness.  Neurological: He is alert and oriented to person, place, and time. He has normal reflexes. He displays normal reflexes. No cranial nerve deficit. He exhibits normal muscle tone. Coordination normal.  Left extensor hallucis longus 4/5. Decreased sensation left L5 dermatome. Otherwise intact  Skin: Skin is warm and dry. No rash noted. No erythema. No pallor.  Psychiatric: He has a normal mood and affect. His behavior is normal. Judgment and thought content normal.     Assessment/Plan Central L4-5 herniated pulposus with radiculopathy. Plan left L4-5 laminotomy and microdiscectomy. Risks and benefits been explained. Patient wishes to proceed.  Jasdeep Kepner A 07/10/2012, 7:37 AM

## 2012-07-10 NOTE — Anesthesia Procedure Notes (Signed)
Procedure Name: Intubation Date/Time: 07/10/2012 7:52 AM Performed by: Marni Griffon Pre-anesthesia Checklist: Patient identified, Emergency Drugs available, Suction available and Patient being monitored Patient Re-evaluated:Patient Re-evaluated prior to inductionOxygen Delivery Method: Circle system utilized Preoxygenation: Pre-oxygenation with 100% oxygen Intubation Type: IV induction Ventilation: Oral airway inserted - appropriate to patient size Laryngoscope Size: Mac and 4 Grade View: Grade II Tube type: Oral Tube size: 7.5 mm Number of attempts: 1 Airway Equipment and Method: Stylet Placement Confirmation: ETT inserted through vocal cords under direct vision,  breath sounds checked- equal and bilateral and positive ETCO2 Secured at: 22 (cm at teeth) cm Tube secured with: Tape Dental Injury: Teeth and Oropharynx as per pre-operative assessment  Comments: Beard precluded good seal with face mask

## 2012-07-10 NOTE — Op Note (Signed)
Date of procedure: 07/10/2012  Date of dictation: Same  Service: Neurosurgery  Preoperative diagnosis: Central L4-5 herniated nucleus pulposus with stenosis and incomplete cauda equina syndrome.  Postoperative diagnosis: Same  Procedure Name: Left L4-5 laminotomy and microdiscectomy.  Surgeon:Vicy Medico A.Mahaley Schwering, M.D.  Asst. Surgeon: Yetta Barre  Anesthesia: General  Indication: 62 year old male with severe back and bilateral lower extremity pain left greater than right. Workup demonstrates evidence of a very large central disc herniation L4-5 with severe compression upon the thecal sac and L5 nerve roots bilaterally. Patient presents now for left-sided L4-5 laminotomy and microdiscectomy in hopes of improving his symptoms.  Operative note: After induction of anesthesia, patient positioned prone onto Wilson frame and appropriately padded. Lumbar region prepped and draped. Incision made overlying the L4-5 interspace. This carried down sharply in the midline. Supper off dissection then performed the left side exposing the lamina facet joints L4 and L5. Deep self-retaining traction placed intraoperative x-rays taken level was confirmed. Laminotomies then performed using high-speed drill and Kerrison rongeurs to remove the inferior aspect the lamina of L4 medial aspect the L4-5 facet joint and the superior rim of the L5 lamina. Ligament flavum was elevated and resected piecemeal fashion using Kerrison rongeurs. Underlying thecal sac and L5 nerve root were notified. Microscope brought field these might resection of the spinal canal. Epidural venous plexus was quite related and cut. Thecal sac and L5 nerve root gently mobilized. Disc herniation was encountered and dissected free using blunt nerve hooks and coronary dilators. This was then removed using pituitary rongeurs. Number very large pieces were obtained. The annulus itself was grossly disrupted and disc herniation was intermingled with it still causing  compression of thecal sac. With this in mind annulus was incised. An aggressive discectomy was then performed by removing all elements of the disc herniation and any loose are obvious he jammed his. And interspace. This went very thorough discectomy been achieved. There is no his injury to the thecal sac and nerve roots. Almost the disc herniation were removed. At this point the wound was irrigated with and bike solution. Hemostasis was achieved with bipolar try repaired wounds and close in layers with Vicryl sutures. Steri-Strips and sterile dressing were applied. There were no apparent locations. Patient tolerated the procedure well and he returns to the recovery room postop.

## 2012-07-10 NOTE — Discharge Summary (Signed)
Physician Discharge Summary  Patient ID: PRANIT OWENSBY MRN: [id number] DOB/AGE: 1950/06/12 62 y.o.  Admit date: 07/10/2012 Discharge date: 07/10/2012  Admission Diagnoses:  Discharge Diagnoses:  Principal Problem:   HNP (herniated nucleus pulposus), lumbar   Discharged Condition: good  Hospital Course: Patient admitted the hospital where he underwent uncomplicated left-sided L4-5 laminotomy and microdiscectomy postoperative quite well peer back and lower trimming pain much improved. Strength sensation intact. Wound healing well. Ready for discharge home.  Consults:   Significant Diagnostic Studies:   Treatments:   Discharge Exam: Blood pressure 147/80, pulse 56, temperature 98 F (36.7 C), temperature source Oral, resp. rate 20, height 5\' 11"  (1.803 m), weight 89.359 kg (197 lb), SpO2 96.00%. Awake and alert oriented and appropriate. Cranial nerve function intact. Motor and sensory function extremities normal. Wound clean and dry. Chest abdomen benign.  Disposition: Final discharge disposition not confirmed     Medication List    STOP taking these medications       methylPREDNISolone 4 MG tablet  Commonly known as:  MEDROL DOSEPAK      TAKE these medications       calcium carbonate 500 MG chewable tablet  Commonly known as:  TUMS - dosed in mg elemental calcium  Chew 1 tablet by mouth daily as needed for heartburn.     cyclobenzaprine 10 MG tablet  Commonly known as:  FLEXERIL  Take 1 tablet (10 mg total) by mouth 3 (three) times daily as needed for muscle spasms.     FERROUS GLUCONATE PO  Take 1 tablet by mouth once a week. Takes on Saturdays.     HYDROcodone-acetaminophen 5-325 MG per tablet  Commonly known as:  NORCO  Take 1 tablet by mouth every 4 (four) hours as needed for pain.     multivitamin with minerals Tabs  Take 1 tablet by mouth daily.     PREVACID PO  Take 1 tablet by mouth daily.     valsartan-hydrochlorothiazide 80-12.5 MG per tablet   Commonly known as:  DIOVAN-HCT  Take 1 tablet by mouth daily.           Follow-up Information   Follow up with Chellsie Gomer A, MD. Schedule an appointment as soon as possible for a visit in 1 week. (Ask for Lurena Joiner extension [phone])    Contact information:   [address] [place] [address] [phone]       Signed: Julio Sicks A 07/10/2012, 1:05 PM

## 2012-07-10 NOTE — Transfer of Care (Signed)
Immediate Anesthesia Transfer of Care Note  Patient: Jonathan Dixon  Procedure(s) Performed: Procedure(s) with comments: Left lumbar four-five microdiskectomy (Left) - Left lumbar four-five microdiskectomy  Patient Location: PACU  Anesthesia Type:General  Level of Consciousness: awake, alert , oriented and patient cooperative  Airway & Oxygen Therapy: Patient Spontanous Breathing and Patient connected to nasal cannula oxygen  Post-op Assessment: Report given to PACU RN and Post -op Vital signs reviewed and stable  Post vital signs: Reviewed and stable  Complications: No apparent anesthesia complications

## 2012-07-10 NOTE — Anesthesia Postprocedure Evaluation (Signed)
Anesthesia Post Note  Patient: Jonathan Dixon  Procedure(s) Performed: Procedure(s) (LRB): Left lumbar four-five microdiskectomy (Left)  Anesthesia type: General  Patient location: PACU  Post pain: Pain level controlled and Adequate analgesia  Post assessment: Post-op Vital signs reviewed, Patient's Cardiovascular Status Stable, Respiratory Function Stable, Patent Airway and Pain level controlled  Last Vitals:  Filed Vitals:   07/10/12 0941  BP: 124/83  Pulse: 49  Temp:   Resp: 16    Post vital signs: Reviewed and stable  Level of consciousness: awake, alert  and oriented  Complications: No apparent anesthesia complications

## 2012-07-10 NOTE — Preoperative (Signed)
Beta Blockers   Reason not to administer Beta Blockers:Not Applicable 

## 2012-07-10 NOTE — Anesthesia Preprocedure Evaluation (Addendum)
Anesthesia Evaluation  Patient identified by MRN, date of birth, ID band Patient awake    Reviewed: Allergy & Precautions, H&P , NPO status , Patient's Chart, lab work & pertinent test results, reviewed documented beta blocker date and time   Airway Mallampati: II TM Distance: >3 FB Neck ROM: full    Dental  (+) Teeth Intact and Dental Advisory Given   Pulmonary          Cardiovascular hypertension, Pt. on medications     Neuro/Psych    GI/Hepatic GERD-  Medicated and Controlled,  Endo/Other    Renal/GU      Musculoskeletal   Abdominal   Peds  Hematology   Anesthesia Other Findings Full beard, neatly trimmed  Reproductive/Obstetrics                         Anesthesia Physical Anesthesia Plan  ASA: II  Anesthesia Plan: General   Post-op Pain Management:    Induction: Intravenous  Airway Management Planned: Oral ETT  Additional Equipment:   Intra-op Plan:   Post-operative Plan: Extubation in OR  Informed Consent: I have reviewed the patients History and Physical, chart, labs and discussed the procedure including the risks, benefits and alternatives for the proposed anesthesia with the patient or authorized representative who has indicated his/her understanding and acceptance.     Plan Discussed with: CRNA, Anesthesiologist and Surgeon  Anesthesia Plan Comments:         Anesthesia Quick Evaluation

## 2012-07-14 ENCOUNTER — Encounter (HOSPITAL_COMMUNITY): Payer: Self-pay | Admitting: Neurosurgery

## 2012-07-29 NOTE — Telephone Encounter (Signed)
Dr Lemar Livings called to ck status of Diovan HCT refill; [facility] lined remained busy, Dr Lemar Livings said to leave v/m on home phone. Spoke with Marchelle Folks at [facility] and med filled on 06/05 and ready for pick up. Left v/m for Dr Lemar Livings on home phone as instructed.-

## 2012-10-31 ENCOUNTER — Other Ambulatory Visit: Payer: Self-pay | Admitting: Family Medicine

## 2012-11-02 NOTE — Telephone Encounter (Signed)
Sent, would schedule a CPE.  Thanks.

## 2012-11-02 NOTE — Telephone Encounter (Signed)
Looks like patient had surgery back in May.  Please advise.

## 2012-11-03 NOTE — Telephone Encounter (Signed)
Left detailed message on voicemail.  

## 2012-11-26 ENCOUNTER — Encounter: Payer: 59 | Admitting: Family Medicine

## 2012-12-02 ENCOUNTER — Ambulatory Visit (INDEPENDENT_AMBULATORY_CARE_PROVIDER_SITE_OTHER): Payer: 59 | Admitting: Family Medicine

## 2012-12-02 ENCOUNTER — Encounter: Payer: Self-pay | Admitting: Family Medicine

## 2012-12-02 VITALS — BP 126/80 | HR 74 | Temp 98.3°F | Ht 70.0 in | Wt 200.5 lb

## 2012-12-02 DIAGNOSIS — I10 Essential (primary) hypertension: Secondary | ICD-10-CM

## 2012-12-02 DIAGNOSIS — Z0000 Encounter for general adult medical examination without abnormal findings: Secondary | ICD-10-CM | POA: Insufficient documentation

## 2012-12-02 MED ORDER — VALSARTAN-HYDROCHLOROTHIAZIDE 160-25 MG PO TABS: 1.0000 | ORAL_TABLET | Freq: Every day | ORAL | Status: DC

## 2012-12-02 NOTE — Patient Instructions (Addendum)
Check with your insurance to see if they will cover the shingles shot after your birthday. Go to the lab on the way out.  We'll contact you with your lab report. Take care.  Glad to see you.

## 2012-12-02 NOTE — Assessment & Plan Note (Signed)
Routine anticipatory guidance given to patient.  See health maintenance. Flu shot done at work. tetanus 2005 Shingles shot d/w pt.  Can be done at 62.   PSA prev normal.   Colonoscopy 2010 Diet and exercise d/w pt.  Encouraged both.   Living will prev done.  Wife designated if incapacitated.  See scanned labs.

## 2012-12-02 NOTE — Assessment & Plan Note (Signed)
Controlled, continue current meds.  BMET pending.

## 2012-12-02 NOTE — Progress Notes (Signed)
CPE- See plan.  Routine anticipatory guidance given to patient.  See health maintenance. Flu shot done at work. tetanus 2005 Shingles shot d/w pt.  Can be done at 62.   PSA prev normal.   Colonoscopy 2010 Diet and exercise d/w pt.  Encouraged both.   Living will prev done.  Wife designated if incapacitated.    Hypertension:    Using medication without problems or lightheadedness: yes Chest pain with exertion:no Edema:no Short of breath:no BMET pending.  BP controlled on current dose of med.   Prev labs reviewed.   PMH and SH reviewed  Meds, vitals, and allergies reviewed.   ROS: See HPI.  Otherwise negative.    GEN: nad, alert and oriented HEENT: mucous membranes moist NECK: supple w/o LA CV: rrr. PULM: ctab, no inc wob ABD: soft, +bs EXT: no edema SKIN: no acute rash

## 2012-12-03 LAB — BASIC METABOLIC PANEL
BUN: 22 mg/dL (ref 6–23)
CO2: 25 mEq/L (ref 19–32)
Calcium: 9.8 mg/dL (ref 8.4–10.5)
Chloride: 104 mEq/L (ref 96–112)
Creatinine, Ser: 1 mg/dL (ref 0.4–1.5)
GFR: 83.97 mL/min (ref >60.00)
Glucose, Bld: 97 mg/dL (ref 70–99)
Potassium: 3.7 mEq/L (ref 3.5–5.1)
Sodium: 141 mEq/L (ref 135–145)

## 2012-12-04 ENCOUNTER — Encounter: Payer: Self-pay | Admitting: *Deleted

## 2012-12-08 ENCOUNTER — Encounter: Payer: Self-pay | Admitting: Family Medicine

## 2012-12-08 LAB — PSA
ALT: 24 U/L (ref 10–40)
AST: 22 U/L
Cholesterol, Total: 176
Creat: 0.88
Glucose: 90
HDL: 39 mg/dL (ref 35–70)
LDL Cholesterol: 115 mg/dL
PSA: 0.57
Triglycerides: 111

## 2013-04-02 ENCOUNTER — Ambulatory Visit: Payer: Self-pay | Admitting: Unknown Physician Specialty

## 2013-04-05 ENCOUNTER — Ambulatory Visit: Payer: Self-pay | Admitting: Cardiology

## 2013-09-13 ENCOUNTER — Telehealth: Payer: Self-pay

## 2013-09-13 MED ORDER — AZITHROMYCIN 250 MG PO TABS: ORAL_TABLET | ORAL | Status: DC

## 2013-09-13 NOTE — Telephone Encounter (Signed)
Patient advised.

## 2013-09-13 NOTE — Telephone Encounter (Signed)
Sent.  I would hold it for a few days since it just started and f/u prn.  Thanks.

## 2013-09-13 NOTE — Telephone Encounter (Signed)
Pt said prod cough with green phlegm started on 09/12/13; pt is traveling but will return home tonight ; pt does not have head congestion and does not think he has fever; pt does not have wheezing or SOB.pt thought might need antibiotic to [facility]. Pt request cb.

## 2013-12-16 ENCOUNTER — Telehealth: Payer: Self-pay | Admitting: Family Medicine

## 2013-12-16 ENCOUNTER — Other Ambulatory Visit: Payer: Self-pay | Admitting: Family Medicine

## 2013-12-16 DIAGNOSIS — M791 Myalgia, unspecified site: Secondary | ICD-10-CM

## 2013-12-16 DIAGNOSIS — W57XXXA Bitten or stung by nonvenomous insect and other nonvenomous arthropods, initial encounter: Secondary | ICD-10-CM

## 2013-12-16 DIAGNOSIS — R5383 Other fatigue: Secondary | ICD-10-CM

## 2013-12-16 NOTE — Telephone Encounter (Addendum)
I put in for all except for the free testosterone and the T3/T4. The other labs should cover it, at least initially. We can add the others if needed and I am trying to avoid a false positive. FYI, I converted this to a phone note to have some record. Let met get the labs and we'll go from there. I would get them done early AM, after a night when you were not on call. Thanks.   Brigitte Pulse    Message copied by Tonia Ghent on Thu Dec 16, 2013 12:47 AM ------      Message from: Rosalia, Forest Gleason      Created: Mon Dec 13, 2013  3:50 PM       Thanks for getting back to me.  I have a friend (MD) in [place] who has been working with bioequivalent  hormone therapy, who suggested the following labs as well, if you don't mind:      TSH, free T4, free T3, total and free testosterone.  PSA last year was OK so he was not so interested in that value.      I'd like to get the labs first here at the draw station in the building, then come over to review.  Let me know if that works for you.       ----- Message -----         From: Tonia Ghent, MD         Sent: 12/12/2013   9:27 PM           To: Robert Bellow, MD            We could get the labs first, or have you come in (and do the labs at that point).  I am okay with either.        I would think that CBC/CMET/ESR/CK/TSH +/- tick labs would cover a lot of ground, especially since you aren't on a statin.  I didn't put in the orders yet.  Let me know about your preference on scheduling.  Thanks.       Brigitte Pulse            ----- Message -----         From: Robert Bellow, MD         Sent: 12/10/2013   3:24 PM           To: Tonia Ghent, MD            Shawn:      I don't know how to use mychart, so I am sending a message this way.      Two month history of decreased energy ( at 9 PM I am ready for bed), aching muscles and joints.      Multiple variables:      Several tick bites earlier in the summer.      Painting a new house (more times up  and down a ladder than I can count).            No weight loss, fevers, sweats.            Glad to come in for a poke and prod, labs etc if needed.              Look forward to your advice.                          ------

## 2013-12-20 ENCOUNTER — Other Ambulatory Visit: Payer: Self-pay | Admitting: *Deleted

## 2013-12-20 DIAGNOSIS — R5383 Other fatigue: Secondary | ICD-10-CM

## 2013-12-20 DIAGNOSIS — M25579 Pain in unspecified ankle and joints of unspecified foot: Secondary | ICD-10-CM

## 2014-01-19 ENCOUNTER — Ambulatory Visit (INDEPENDENT_AMBULATORY_CARE_PROVIDER_SITE_OTHER): Payer: 59 | Admitting: Family Medicine

## 2014-01-19 ENCOUNTER — Encounter: Payer: Self-pay | Admitting: General Surgery

## 2014-01-19 ENCOUNTER — Encounter: Payer: Self-pay | Admitting: Family Medicine

## 2014-01-19 VITALS — BP 124/80 | HR 59 | Temp 97.3°F | Wt 206.2 lb

## 2014-01-19 DIAGNOSIS — K219 Gastro-esophageal reflux disease without esophagitis: Secondary | ICD-10-CM

## 2014-01-19 DIAGNOSIS — Z23 Encounter for immunization: Secondary | ICD-10-CM

## 2014-01-19 MED ORDER — VALSARTAN-HYDROCHLOROTHIAZIDE 160-25 MG PO TABS: 1.0000 | ORAL_TABLET | Freq: Every day | ORAL | Status: DC

## 2014-01-19 NOTE — Patient Instructions (Signed)
Try to get back on the treadmill.  I'll await the notes from GI.  Take care.  Glad to see you.  If you have other changes, then let me know.

## 2014-01-19 NOTE — Progress Notes (Signed)
Pre visit review using our clinic review tool, if applicable. No additional management support is needed unless otherwise documented below in the visit note.  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  No hand/wrist synovitis.  Some fatigue noted, with changes in sleep pattern possibly contributing.  He has tried melatonin and benadryl in the past for sleep.   Noted some SSCP that was only at rest, not consistently happening.  No exertional sx, good exercise tolerance.  Not SOB, reportedly had CXR at [facility] this year and was neg.  Mult neg stress tests prev.  No BLE edema.  H/o GERD and ha f/u with GI pending.    Labs reviewed with patient.   Meds, vitals, and allergies reviewed.   ROS: See HPI.  Otherwise, noncontributory.  GEN: nad, alert and oriented HEENT: mucous membranes moist NECK: supple w/o LA CV: rrr.  no murmur PULM: ctab, no inc wob ABD: soft, +bs EXT: no edema SKIN: no acute rash No synovitis on hand inspection B

## 2014-01-20 ENCOUNTER — Encounter: Payer: Self-pay | Admitting: Family Medicine

## 2014-01-20 NOTE — Assessment & Plan Note (Signed)
He has zero exertional sx.  He likely has a combination of issues, GERD, joint aches explained by increase workload at home and fatigue from sleep changes.  He has f/u with GI pending, that is reasonable.  He has had mult neg cards evals prev.  Labs unremarkable.  Would be reasonable to add on more exercise/cardio to see if that helps with sleep.   I'll await GI f/u.  He agrees.  No sign of ominous dx.

## 2014-01-27 ENCOUNTER — Ambulatory Visit: Payer: Self-pay | Admitting: Unknown Physician Specialty

## 2014-01-27 LAB — HM COLONOSCOPY

## 2014-02-08 ENCOUNTER — Encounter: Payer: Self-pay | Admitting: Family Medicine

## 2014-02-14 ENCOUNTER — Other Ambulatory Visit: Payer: Self-pay

## 2014-02-14 ENCOUNTER — Encounter: Payer: Self-pay | Admitting: Family Medicine

## 2014-06-06 ENCOUNTER — Encounter: Payer: Self-pay | Admitting: Family Medicine

## 2014-06-06 LAB — GLUCOSE (CC13)
A1c: 5.8
Cholesterol, Total: 184
Creatinine, Ser: 0.83 mg/dL (ref 0.50–1.10)
Glucose: 100
HDL: 44 mg/dL (ref 35–70)
LDL (calc): 121
PSA: 0.6
TSH: 1.73
Triglycerides: 94

## 2014-06-07 NOTE — Op Note (Signed)
PATIENT NAME:  Jonathan Dixon, Jonathan Dixon MR#:  [id number] DATE OF BIRTH:  1950/09/26  DATE OF PROCEDURE:  09/24/2011  PREOPERATIVE DIAGNOSIS: Visually significant cataract of the left eye.   POSTOPERATIVE DIAGNOSIS: Visually significant cataract of the left eye.   OPERATIVE PROCEDURE: Cataract extraction by phacoemulsification with implant of intraocular lens to left eye.   SURGEON: Birder Robson, MD.   ANESTHESIA:  1. Managed anesthesia care.  2. Topical tetracaine drops followed by 2% Xylocaine jelly applied in the preoperative holding area.   COMPLICATIONS: None.   TECHNIQUE:  Stop-and-chop    DESCRIPTION OF PROCEDURE: The patient was examined and consented in the preoperative holding area where the aforementioned topical anesthesia was applied to the left eye and then brought back to the Operating Room where the left eye was prepped and draped in the usual sterile ophthalmic fashion and a lid speculum was placed. A paracentesis was created with the side port blade and the anterior chamber was filled with viscoelastic. A near clear corneal incision was performed with the steel keratome. A continuous curvilinear capsulorrhexis was performed with a cystotome followed by the capsulorrhexis forceps. Hydrodissection and hydrodelineation were carried out with BSS on a blunt cannula. The lens was removed in a stop-and-chop technique and the remaining cortical material was removed with the irrigation-aspiration handpiece. The capsular bag was inflated with viscoelastic and the Technus ZCB00 14.0-diopter lens, serial number [id number] was placed in the capsular bag without complication. The remaining viscoelastic was removed from the eye with the irrigation-aspiration handpiece. The wounds were hydrated. The anterior chamber was flushed with Miostat and the eye was inflated to physiologic pressure. The wounds were found to be water tight. The eye was dressed with Vigamox. The patient was given protective  glasses to wear throughout the day and a shield with which to sleep tonight. The patient was also given drops with which to begin a drop regimen today and will follow-up with me in one day.   ____________________________ Livingston Diones. Syble Picco, MD wlp:drc D: 09/24/2011 12:35:58 ET T: 09/24/2011 13:22:28 ET JOB#: [id number]  cc: Kiriana Worthington L. Airon Sahni, MD, <Dictator> Livingston Diones Phylicia Mcgaugh MD ELECTRONICALLY SIGNED 09/25/2011 16:28

## 2014-06-07 NOTE — Op Note (Signed)
PATIENT NAME:  Jonathan Dixon, Jonathan Dixon MR#:  [id number] DATE OF BIRTH:  [birth year]  DATE OF PROCEDURE:  10/01/2011  PREOPERATIVE DIAGNOSIS: Visually significant cataract of the right eye.   POSTOPERATIVE DIAGNOSIS: Visually significant cataract of the right eye.   OPERATIVE PROCEDURE: Cataract extraction by phacoemulsification with implant of intraocular lens to right eye.   SURGEON: Birder Robson, MD   ANESTHESIA:  1. Managed anesthesia care.  2. Topical tetracaine drops followed by 2% Xylocaine jelly applied in the preoperative holding area.   COMPLICATIONS: None.   TECHNIQUE:  Four quadrant divide-and-conquer    DESCRIPTION OF PROCEDURE: The patient was examined and consented in the preoperative holding area where the aforementioned topical anesthesia was applied to the right eye and then brought back to the Operating Room where the right eye was prepped and draped in the usual sterile ophthalmic fashion and a lid speculum was placed. A paracentesis was created with the side port blade and the anterior chamber was filled with viscoelastic. A near clear corneal incision was performed with the steel keratome. A continuous curvilinear capsulorrhexis was performed with a cystotome followed by the capsulorrhexis forceps. Hydrodissection and hydrodelineation were carried out with BSS on a blunt cannula. The lens was removed in a four quadrant divide-and-conquer technique and the remaining cortical material was removed with the irrigation-aspiration handpiece. The capsular bag was inflated with viscoelastic and the Technus ZCB00 11.5-diopter lens, serial number [id number] was placed in the capsular bag without complication. The remaining viscoelastic was removed from the eye with the irrigation-aspiration handpiece. The wounds were hydrated. The anterior chamber was flushed with Miostat and the eye was inflated to physiologic pressure. The wounds were found to be water tight. The eye was dressed with  Vigamox. The patient was given protective glasses to wear throughout the day and a shield with which to sleep tonight. The patient was also given drops with which to begin a drop regimen today and will follow-up with me in one day.   ____________________________ Jonathan Dixon. Jonathan Eble, MD wlp:drc D: 10/01/2011 16:59:05 ET T: 10/01/2011 17:40:23 ET JOB#: [id number]  cc: Jonathan Rudie L. Harvie Morua, MD, <Dictator> Jonathan Dixon Llewelyn Sheaffer MD ELECTRONICALLY SIGNED 10/02/2011 15:32

## 2014-06-13 LAB — SURGICAL PATHOLOGY

## 2014-10-14 IMAGING — CR DG CHEST 2V
1 series · 2 of 2 positions shown · non-contrast
Comparison: 07/08/2012

CLINICAL DATA: Cough, chest pain

EXAM:
CHEST  2 VIEW

[Series 1: w chest pa · 0.14mm/px · 2 of 2 slices shown]
[im 1/2]
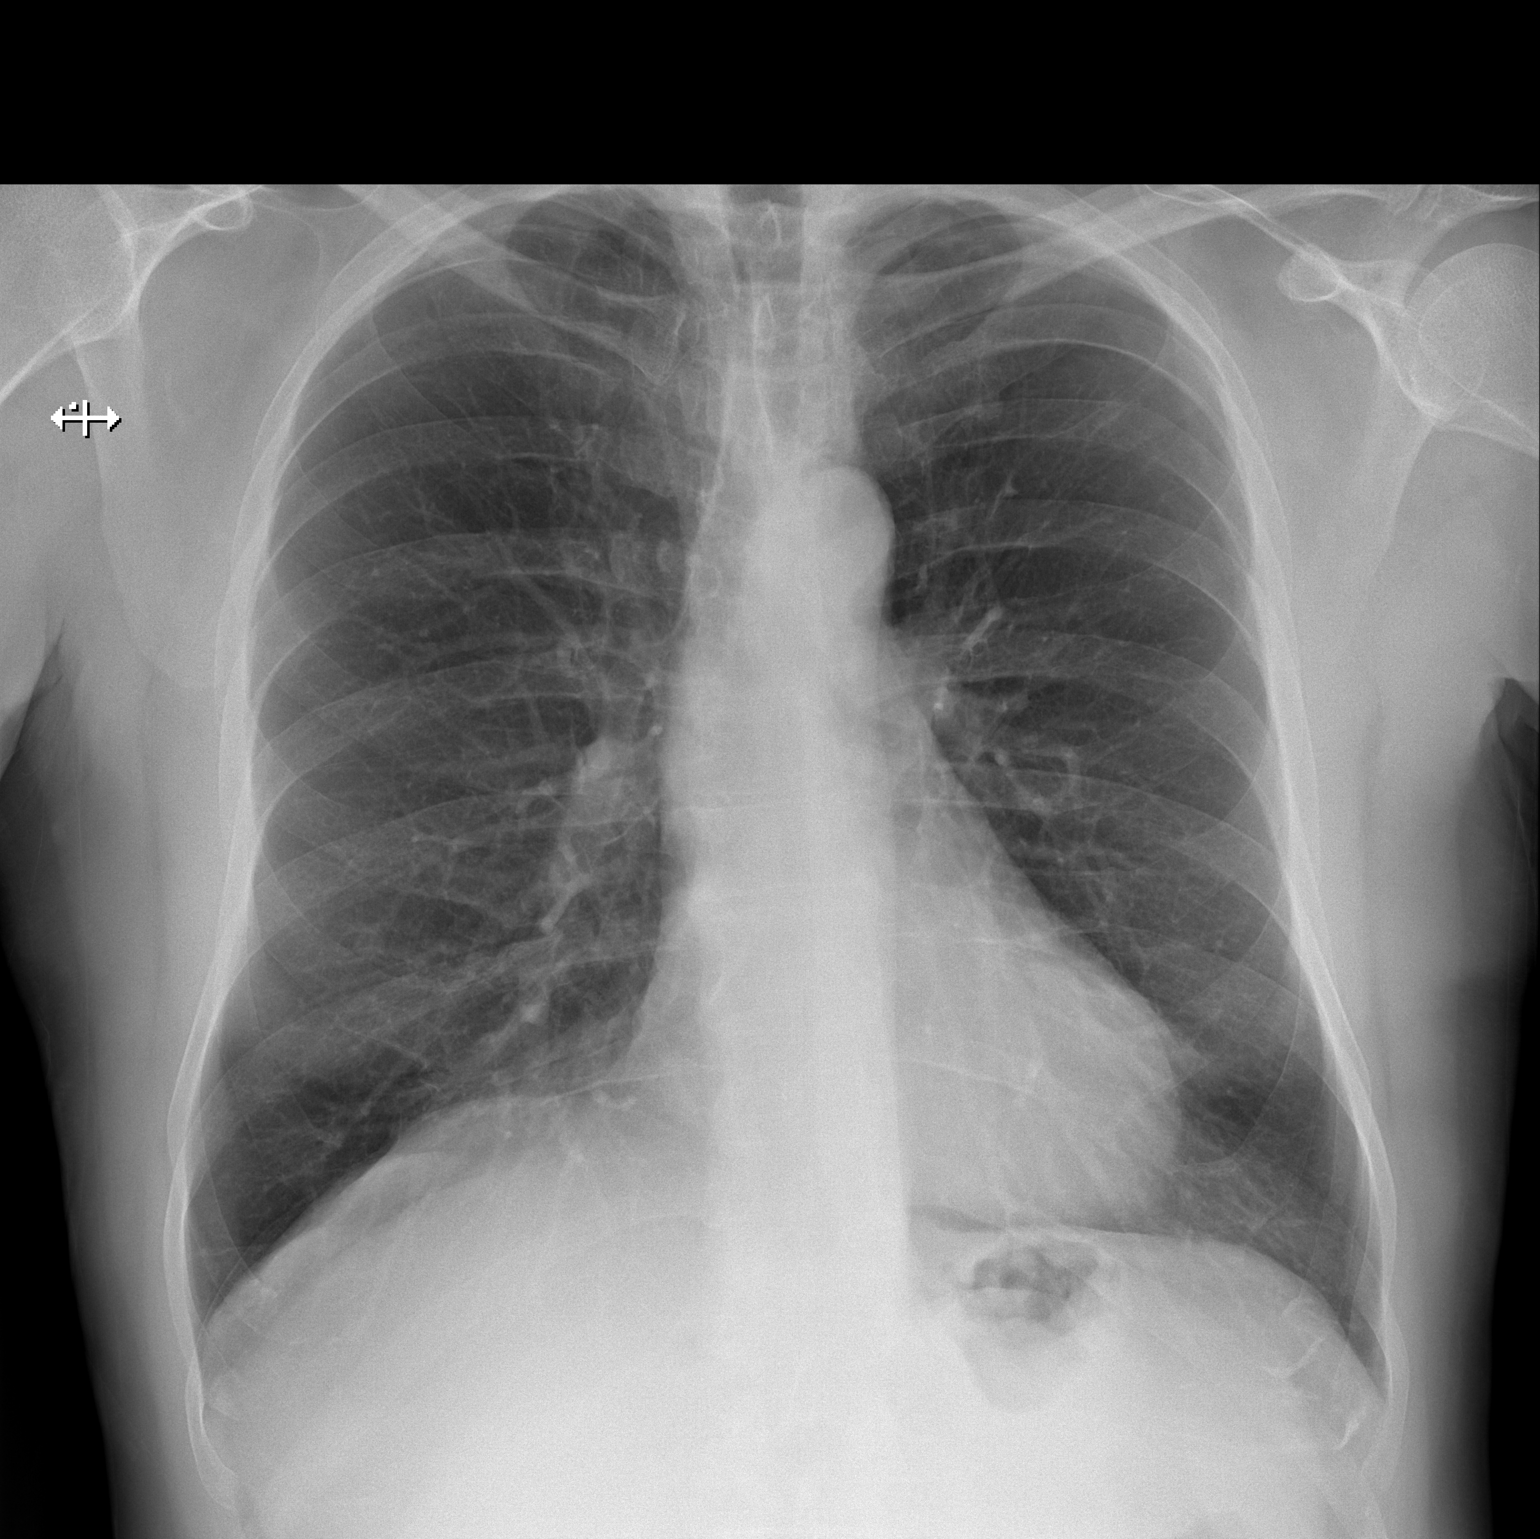
[im 2/2]
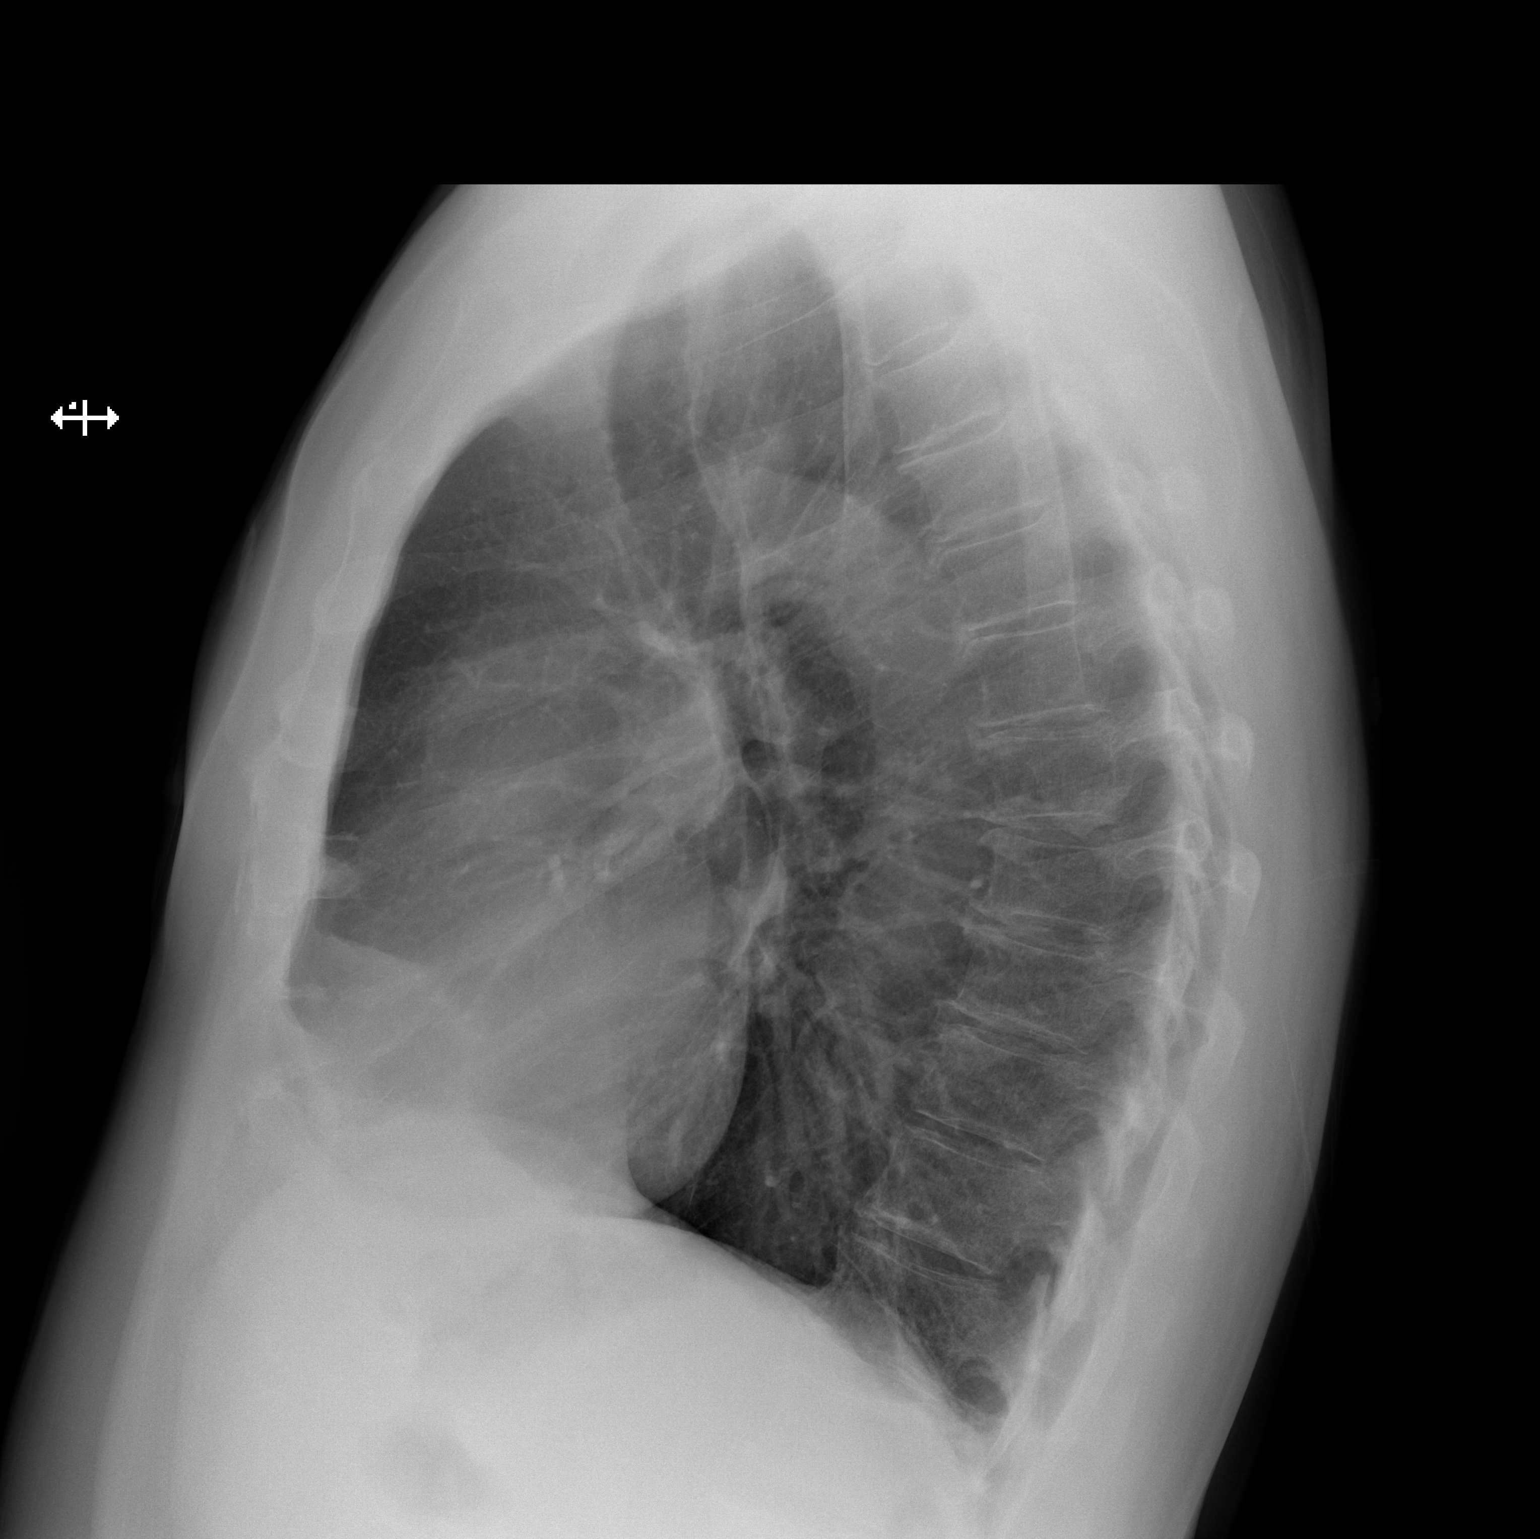

[2 of 2 positions shown; findings below may reference images not displayed]

FINDINGS: Cardiomediastinal silhouette is stable. No acute infiltrate or
pleural effusion. No pulmonary edema. Mild degenerative changes
thoracic spine.
IMPRESSION: No active cardiopulmonary disease.

## 2014-11-23 ENCOUNTER — Other Ambulatory Visit: Payer: Self-pay | Admitting: Internal Medicine

## 2015-01-09 ENCOUNTER — Other Ambulatory Visit: Payer: Self-pay | Admitting: Family Medicine

## 2015-02-28 ENCOUNTER — Telehealth: Payer: Self-pay | Admitting: Family Medicine

## 2015-02-28 NOTE — Telephone Encounter (Signed)
Patient scheduled appointment for a physical on 03/20/15.  Please fax lab order to his office. Fax # [phone].

## 2015-03-01 NOTE — Telephone Encounter (Signed)
Added to the hard copy.  Thanks.

## 2015-03-01 NOTE — Telephone Encounter (Signed)
Order written for cmet, lipid.  Dx I10.   Let me know if he wants PSA added on.  Thanks.

## 2015-03-01 NOTE — Telephone Encounter (Signed)
Spoke to patient and was advised that he does want you to add a PSA. Order is on your desk to add to.

## 2015-03-01 NOTE — Telephone Encounter (Signed)
Order faxed as instructed. 

## 2015-03-09 DIAGNOSIS — I10 Essential (primary) hypertension: Secondary | ICD-10-CM | POA: Diagnosis not present

## 2015-03-09 DIAGNOSIS — Z125 Encounter for screening for malignant neoplasm of prostate: Secondary | ICD-10-CM | POA: Diagnosis not present

## 2015-03-10 ENCOUNTER — Encounter: Payer: Self-pay | Admitting: Family Medicine

## 2015-03-13 ENCOUNTER — Encounter: Payer: Self-pay | Admitting: Family Medicine

## 2015-03-20 ENCOUNTER — Encounter: Payer: Self-pay | Admitting: Family Medicine

## 2015-03-20 ENCOUNTER — Ambulatory Visit (INDEPENDENT_AMBULATORY_CARE_PROVIDER_SITE_OTHER)
Admission: RE | Admit: 2015-03-20 | Discharge: 2015-03-20 | Disposition: A | Discharge status: Home / Self Care | Payer: 59 | Source: Ambulatory Visit | Attending: Family Medicine | Admitting: Family Medicine

## 2015-03-20 ENCOUNTER — Ambulatory Visit (INDEPENDENT_AMBULATORY_CARE_PROVIDER_SITE_OTHER): Payer: 59 | Admitting: Family Medicine

## 2015-03-20 ENCOUNTER — Other Ambulatory Visit: Payer: Self-pay | Admitting: Family Medicine

## 2015-03-20 VITALS — BP 128/84 | HR 71 | Temp 97.4°F | Ht 70.0 in | Wt 196.5 lb

## 2015-03-20 DIAGNOSIS — T148 Other injury of unspecified body region: Secondary | ICD-10-CM | POA: Diagnosis not present

## 2015-03-20 DIAGNOSIS — Z0001 Encounter for general adult medical examination with abnormal findings: Secondary | ICD-10-CM | POA: Diagnosis not present

## 2015-03-20 DIAGNOSIS — Z0000 Encounter for general adult medical examination without abnormal findings: Secondary | ICD-10-CM

## 2015-03-20 DIAGNOSIS — W57XXXA Bitten or stung by nonvenomous insect and other nonvenomous arthropods, initial encounter: Secondary | ICD-10-CM

## 2015-03-20 DIAGNOSIS — I10 Essential (primary) hypertension: Secondary | ICD-10-CM | POA: Diagnosis not present

## 2015-03-20 DIAGNOSIS — R05 Cough: Secondary | ICD-10-CM | POA: Diagnosis not present

## 2015-03-20 DIAGNOSIS — Z7189 Other specified counseling: Secondary | ICD-10-CM

## 2015-03-20 DIAGNOSIS — M791 Myalgia, unspecified site: Secondary | ICD-10-CM

## 2015-03-20 DIAGNOSIS — R059 Cough, unspecified: Secondary | ICD-10-CM

## 2015-03-20 MED ORDER — VALSARTAN-HYDROCHLOROTHIAZIDE 160-25 MG PO TABS: 1.0000 | ORAL_TABLET | Freq: Every day | ORAL | Status: DC

## 2015-03-20 NOTE — Progress Notes (Signed)
Pre visit review using our clinic review tool, if applicable. No additional management support is needed unless otherwise documented below in the visit note.  CPE- See plan.  Routine anticipatory guidance given to patient.  See health maintenance. Tetanus  2015 Flu ~10/2014 Shingles 2015 PNA not due.  D/w pt.   HIV and HCV prev neg.   Living will d/w pt.  Wife designated if patient were incapacitated.   Diet and exercise d/w pt.  Diet is good.  Mainly yardwork. Colonoscopy 2015   PSA wnl, d/w pt.   Labs d/w pt.  He is working on low [employer].    Hypertension:    Using medication without problems or lightheadedness:  yes Chest pain with exertion:no Edema:no Short of breath: Prev exertional SOB hauling corn in the fall of 2016.  He was walking up a grade in the meantime.  No sx on the exercise bike with high exertion.  No sx in the meantime.  His arms don't feel at strong, ie some proximal extremity weakness and aching.  He has trouble lifting a 40lb bag now, used to lift 50s w/o troubles.  No known engorged tick bites, but mult non engorged ticks.  Going on for about 6 months.  No rash.  No fevers, no "B" sx.   Cough for the last 6-8 weeks.  Taking robitussin.  Scant white or cream sputum.  No fevers.  Not SOB o/w.  Cough is worse laying down.  Doesn't seem to be triggered by foods.  No typical allergy sx.  No rhinorrhea.  Worse in winter, same as last year.  Hasn't used SABA recently.  PMH and SH reviewed  Meds, vitals, and allergies reviewed.   ROS: See HPI.  Otherwise negative.    GEN: nad, alert and oriented HEENT: mucous membranes moist NECK: supple w/o LA CV: rrr. PULM: ctab, no inc wob ABD: soft, +bs EXT: no edema SKIN: no acute rash No focal muscle wasting or weakness on exam x4 ext   EKG wnl, d/w pt.

## 2015-03-20 NOTE — Patient Instructions (Addendum)
Go to the lab on the way out.  We'll contact you with your lab and xray report. We'll go from there.  Take care.  Glad to see you.   Your EKG looks fine.

## 2015-03-21 LAB — CBC WITH DIFFERENTIAL/PLATELET
Basophils Absolute: 0.1 10*3/uL (ref 0.0–0.1)
Basophils Relative: 0.6 % (ref 0.0–3.0)
Eosinophils Absolute: 0.3 10*3/uL (ref 0.0–0.7)
Eosinophils Relative: 2.6 % (ref 0.0–5.0)
HCT: 47.8 % (ref 39.0–52.0)
Hemoglobin: 16 g/dL (ref 13.0–17.0)
Lymphocytes Relative: 21.2 % (ref 12.0–46.0)
Lymphs Abs: 2.2 10*3/uL (ref 0.7–4.0)
MCHC: 33.4 g/dL (ref 30.0–36.0)
MCV: 92.3 fl (ref 78.0–100.0)
Monocytes Absolute: 0.6 10*3/uL (ref 0.1–1.0)
Monocytes Relative: 6.1 % (ref 3.0–12.0)
Neutro Abs: 7.2 10*3/uL (ref 1.4–7.7)
Neutrophils Relative %: 69.5 % (ref 43.0–77.0)
Platelets: 243 10*3/uL (ref 150.0–400.0)
RBC: 5.18 Mil/uL (ref 4.22–5.81)
RDW: 13.2 % (ref 11.5–15.5)
WBC: 10.4 10*3/uL (ref 4.0–10.5)

## 2015-03-21 LAB — SEDIMENTATION RATE: Sed Rate: 2 mm/hr (ref 0–22)

## 2015-03-21 LAB — CK: Total CK: 123 U/L (ref 7–232)

## 2015-03-21 LAB — TSH: TSH: 2.08 u[IU]/mL (ref 0.35–4.50)

## 2015-03-21 LAB — LYME AB/WESTERN BLOT REFLEX: B burgdorferi Ab IgG+IgM: 0.21 {ISR}

## 2015-03-22 DIAGNOSIS — Z7189 Other specified counseling: Secondary | ICD-10-CM | POA: Insufficient documentation

## 2015-03-22 DIAGNOSIS — W57XXXA Bitten or stung by nonvenomous insect and other nonvenomous arthropods, initial encounter: Secondary | ICD-10-CM | POA: Insufficient documentation

## 2015-03-22 DIAGNOSIS — R059 Cough, unspecified: Secondary | ICD-10-CM | POA: Insufficient documentation

## 2015-03-22 DIAGNOSIS — R05 Cough: Secondary | ICD-10-CM | POA: Insufficient documentation

## 2015-03-22 LAB — ROCKY MTN SPOTTED FVR ABS PNL(IGG+IGM)
RMSF IgG: 3.65 IV — ABNORMAL HIGH
RMSF IgM: 0.25 IV

## 2015-03-22 NOTE — Assessment & Plan Note (Signed)
Some of the subjective muscle complaints may be normal, ie from normal aging, but we should work this up.  D/w pt.  See notes on labs.  Still okay for outpatient f/u.

## 2015-03-22 NOTE — Assessment & Plan Note (Signed)
Tetanus  2015 Flu ~10/2014 Shingles 2015 PNA not due.  D/w pt.   HIV and HCV prev neg.   Living will d/w pt.  Wife designated if patient were incapacitated.   Diet and exercise d/w pt.  Diet is good.  Mainly yardwork. Colonoscopy 2015   PSA wnl, d/w pt.   Labs d/w pt.  He is working on low [employer].

## 2015-03-22 NOTE — Assessment & Plan Note (Signed)
Benign exam, has happened in winter prev, he may have a wintertime allergy, trigger.  See notes on CXR.

## 2015-03-22 NOTE — Assessment & Plan Note (Signed)
Controlled, continue med as is.  I think the prev episode wasn't significant given the good exercise tolerance in the meantime.

## 2015-03-23 ENCOUNTER — Other Ambulatory Visit: Payer: Self-pay | Admitting: Family Medicine

## 2015-03-23 DIAGNOSIS — R911 Solitary pulmonary nodule: Secondary | ICD-10-CM

## 2015-03-24 ENCOUNTER — Telehealth: Payer: Self-pay | Admitting: Family Medicine

## 2015-03-24 NOTE — Telephone Encounter (Signed)
I talked with patient.  Reasonable to proceed with CT with contrast.  Order is in.  Thanks.

## 2015-03-31 ENCOUNTER — Ambulatory Visit
Admission: RE | Admit: 2015-03-31 | Discharge: 2015-03-31 | Disposition: A | Discharge status: Home / Self Care | Payer: 59 | Source: Ambulatory Visit | Attending: Family Medicine | Admitting: Family Medicine

## 2015-03-31 DIAGNOSIS — R918 Other nonspecific abnormal finding of lung field: Secondary | ICD-10-CM | POA: Insufficient documentation

## 2015-03-31 DIAGNOSIS — R911 Solitary pulmonary nodule: Secondary | ICD-10-CM | POA: Diagnosis present

## 2015-03-31 MED ORDER — IOHEXOL 300 MG/ML  SOLN
75.0000 mL | Freq: Once | INTRAMUSCULAR | Status: AC | PRN
  Administered 2015-03-31: 75 mL via INTRAVENOUS

## 2015-04-11 ENCOUNTER — Other Ambulatory Visit: Payer: Self-pay | Admitting: Family Medicine

## 2015-04-11 DIAGNOSIS — M791 Myalgia: Secondary | ICD-10-CM | POA: Diagnosis not present

## 2015-04-11 DIAGNOSIS — M25512 Pain in left shoulder: Secondary | ICD-10-CM | POA: Diagnosis not present

## 2015-04-11 DIAGNOSIS — G8929 Other chronic pain: Secondary | ICD-10-CM | POA: Diagnosis not present

## 2015-04-11 DIAGNOSIS — IMO0001 Reserved for inherently not codable concepts without codable children: Secondary | ICD-10-CM

## 2015-04-11 DIAGNOSIS — M542 Cervicalgia: Secondary | ICD-10-CM | POA: Diagnosis not present

## 2015-04-11 DIAGNOSIS — M25511 Pain in right shoulder: Secondary | ICD-10-CM | POA: Diagnosis not present

## 2015-04-11 DIAGNOSIS — M50322 Other cervical disc degeneration at C5-C6 level: Secondary | ICD-10-CM | POA: Diagnosis not present

## 2015-05-05 ENCOUNTER — Other Ambulatory Visit: Payer: Self-pay | Admitting: Family Medicine

## 2015-05-05 ENCOUNTER — Telehealth: Payer: Self-pay | Admitting: Family Medicine

## 2015-05-05 MED ORDER — AZITHROMYCIN 250 MG PO TABS: ORAL_TABLET | ORAL | Status: DC

## 2015-05-05 NOTE — Telephone Encounter (Signed)
-----   Message from Jonathan Bellow, MD sent at 05/05/2015  9:13 AM EDT ----- Intermittent over the last few weeks, pale, lime green. Not particularily purulent. No one has listened to my chest since I saw you a few weeks ago. ----- Message -----    From: Tonia Ghent, MD    Sent: 05/04/2015   8:56 AM      To: Jonathan Bellow, MD  How long have you had green sputum? How frequent is it? Has anyone listed to your chest recently? Let me know.  Brigitte Pulse  ----- Message -----    From: Jonathan Bellow, MD    Sent: 05/04/2015   8:46 AM      To: Tonia Ghent, MD  Jonathan Dixon: Still coughing, using Tussinex, Tessilon and Robitussin at night to sleep. Lime green sputum at time. No fever, chills, SOB. True pain in the chest. Any suggestions?

## 2015-05-05 NOTE — Telephone Encounter (Signed)
I sent the rx for zithromax to the [facility] pharmacy.  If you aren't improved in the next few days, then I would get checked. I am out of the clinic today but will be back Monday.  Take care.  FYI, I converted this to a phone note for the chart.  Brigitte Pulse

## 2015-06-05 ENCOUNTER — Ambulatory Visit (INDEPENDENT_AMBULATORY_CARE_PROVIDER_SITE_OTHER): Payer: 59 | Admitting: Family Medicine

## 2015-06-05 ENCOUNTER — Encounter: Payer: Self-pay | Admitting: Family Medicine

## 2015-06-05 VITALS — BP 158/96 | HR 70 | Temp 97.6°F | Wt 201.0 lb

## 2015-06-05 DIAGNOSIS — R05 Cough: Secondary | ICD-10-CM

## 2015-06-05 DIAGNOSIS — R059 Cough, unspecified: Secondary | ICD-10-CM

## 2015-06-05 MED ORDER — FLUTICASONE PROPIONATE 50 MCG/ACT NA SUSP: 1.0000 | Freq: Every day | NASAL | Status: DC

## 2015-06-05 MED ORDER — LORATADINE 10 MG PO TABS: 10.0000 mg | ORAL_TABLET | Freq: Every day | ORAL | Status: DC

## 2015-06-05 NOTE — Progress Notes (Signed)
Pre visit review using our clinic review tool, if applicable. No additional management support is needed unless otherwise documented below in the visit note.  3 month of cough.  Overall some better.  No cough with eating, no clearly food or diet triggers.  Annoying.  No fevers, no chills.  Energy level at baseline.   No sputum after taking zithromax, prev with green sputum.  Cough got better last week after being out of town, recently, to [facility].   Tessalon helps, taken prn.  He has some throat clearing in addition to the cough.  He doesn't feel sick.  He thought he as heard some end expiration UAN.    He likely has an unrelated nodule that is being followed by serial CT.  He'll update me about scheduling, d/w pt.    Meds, vitals, and allergies reviewed.   ROS: See HPI.  Otherwise, noncontributory.  nad ncat Tm wnl  Nasal exam wnl, no erythema.  OP wnl except minimal irritation posteriorly Mmm Neck supple, no LA No UAN rrr ctab Cough when supine but that resolves after a few seconds No inc wob No wheeze.

## 2015-06-05 NOTE — Patient Instructions (Signed)
Add on flonase and/or claritin and update me as needed.  Take care.  Glad to see you.

## 2015-06-06 NOTE — Assessment & Plan Note (Signed)
Broad ddx d/w pt.  Likely with allergic component.  Add on flonase and/or claritin and update me as needed He agrees.   Doesn't appear to be PNA/COPD/asthma.  GERD treated.  Not on ACE.  D/w pt about prev imaging.

## 2015-06-22 ENCOUNTER — Telehealth: Payer: Self-pay | Admitting: Family Medicine

## 2015-06-22 DIAGNOSIS — R059 Cough, unspecified: Secondary | ICD-10-CM

## 2015-06-22 DIAGNOSIS — R05 Cough: Secondary | ICD-10-CM

## 2015-06-22 NOTE — Telephone Encounter (Signed)
Called pt.  Cough continues.  D/w pt, reasonable to refer to pulm in [place].  He agrees.  App pulm input.

## 2015-06-22 NOTE — Telephone Encounter (Signed)
Patient called and asked for Dr.Duncan to call him back at his office.  Patient said they'll get him out of a room.

## 2015-07-04 ENCOUNTER — Telehealth: Payer: Self-pay | Admitting: Family Medicine

## 2015-07-04 NOTE — Telephone Encounter (Signed)
D/w pt.  Cough is essentially resolved, he is clearly improved.  He wanted to know about cancelling the pulmonary appointment.   I think it is reasonable to cancel for now.  We can always reschedule if sx return.  I can't imagine that he would have an ominous dx at this point when his sx are better, and he agrees.   He has the routine f/u re: CT pending for later one, for other issues.   He'll call and cancel the pulmonary appointment.

## 2015-07-06 ENCOUNTER — Institutional Professional Consult (permissible substitution): Payer: 59 | Admitting: Internal Medicine

## 2015-07-14 DIAGNOSIS — Z08 Encounter for follow-up examination after completed treatment for malignant neoplasm: Secondary | ICD-10-CM | POA: Diagnosis not present

## 2015-07-14 DIAGNOSIS — L570 Actinic keratosis: Secondary | ICD-10-CM | POA: Diagnosis not present

## 2015-07-14 DIAGNOSIS — B351 Tinea unguium: Secondary | ICD-10-CM | POA: Diagnosis not present

## 2015-07-14 DIAGNOSIS — Z85828 Personal history of other malignant neoplasm of skin: Secondary | ICD-10-CM | POA: Diagnosis not present

## 2015-07-14 DIAGNOSIS — X32XXXA Exposure to sunlight, initial encounter: Secondary | ICD-10-CM | POA: Diagnosis not present

## 2015-08-01 ENCOUNTER — Other Ambulatory Visit: Payer: Self-pay | Admitting: General Surgery

## 2015-09-08 ENCOUNTER — Telehealth: Payer: Self-pay | Admitting: Family Medicine

## 2015-09-08 DIAGNOSIS — H8112 Benign paroxysmal vertigo, left ear: Secondary | ICD-10-CM | POA: Diagnosis not present

## 2015-09-08 DIAGNOSIS — R42 Dizziness and giddiness: Secondary | ICD-10-CM | POA: Diagnosis not present

## 2015-09-08 NOTE — Telephone Encounter (Signed)
Agreed, please get update on patient Monday.  Thanks to all involved.

## 2015-09-08 NOTE — Telephone Encounter (Signed)
Spoke with Dr Fleet Contras today as PCP out of office. He woke up this morning with woozy feeling with nystagmus, markedly worse with laying supine and sudden head movements. + nausea, no vomiting. No headache.  He is hypertensive this morning 160/90 despite diovan HCTZ.  Sounds like BPPV - positional, reproducible and episodes of limited duration. Recommend canalith repositioning maneuvers, suggested eval given concommitant HTN.  He will go across hall to ENT office to be seen, if unable to be seen there will come here for evaluation today.  He has already cancelled today's cases.  fyi to PCP

## 2015-09-11 ENCOUNTER — Telehealth: Payer: Self-pay | Admitting: *Deleted

## 2015-09-11 NOTE — Telephone Encounter (Signed)
Patient says he saw ENT and they did their maneuver and it made everything all better.  He's doing great today.  Patient thanks everyone for their help.

## 2015-09-11 NOTE — Telephone Encounter (Signed)
Noted. Thanks.

## 2015-09-11 NOTE — Telephone Encounter (Signed)
Patient doing well now.

## 2015-09-11 NOTE — Telephone Encounter (Signed)
Thanks

## 2015-09-21 ENCOUNTER — Telehealth: Payer: Self-pay | Admitting: Family Medicine

## 2015-09-21 NOTE — Telephone Encounter (Signed)
Would be time for f/u CT chest re: nodule unless he made other plans with radiology. Please let me know if I need to order.  Thanks.

## 2015-09-22 NOTE — Telephone Encounter (Signed)
Patient advised.   Patient states he has not made other plans with radiology but would like to stretch this out a bit until November.

## 2015-09-24 NOTE — Telephone Encounter (Signed)
Noted. Thanks.

## 2015-10-12 ENCOUNTER — Telehealth: Payer: Self-pay | Admitting: *Deleted

## 2015-10-12 DIAGNOSIS — R911 Solitary pulmonary nodule: Secondary | ICD-10-CM

## 2015-10-12 NOTE — Telephone Encounter (Signed)
I ordered it.  I can't control the scheduling.  We'll see.  Thanks.

## 2015-10-12 NOTE — Telephone Encounter (Signed)
Dr. Bary Castilla was contacted to schedule a follow up CT but requested that we hold off until November.  Now, Dr. Bary Castilla called this morning and asks if we can possibly get his CT scheduled tomorrow before 11 or after 1 pm.  He says this is a CT without contrast so it shouldn't be a problem and if it is, he will contact CT and work it out.

## 2015-10-13 ENCOUNTER — Ambulatory Visit
Admission: RE | Admit: 2015-10-13 | Discharge: 2015-10-13 | Disposition: A | Discharge status: Home / Self Care | Payer: 59 | Source: Ambulatory Visit | Attending: Family Medicine | Admitting: Family Medicine

## 2015-10-13 ENCOUNTER — Other Ambulatory Visit: Payer: Self-pay | Admitting: Cardiology

## 2015-10-13 DIAGNOSIS — Z09 Encounter for follow-up examination after completed treatment for conditions other than malignant neoplasm: Secondary | ICD-10-CM | POA: Diagnosis not present

## 2015-10-13 DIAGNOSIS — R911 Solitary pulmonary nodule: Secondary | ICD-10-CM | POA: Insufficient documentation

## 2015-10-13 DIAGNOSIS — R079 Chest pain, unspecified: Secondary | ICD-10-CM | POA: Diagnosis not present

## 2015-10-15 ENCOUNTER — Encounter: Payer: Self-pay | Admitting: Family Medicine

## 2015-10-15 DIAGNOSIS — I77810 Thoracic aortic ectasia: Secondary | ICD-10-CM | POA: Insufficient documentation

## 2015-10-17 ENCOUNTER — Ambulatory Visit
Admission: RE | Admit: 2015-10-17 | Discharge: 2015-10-17 | Disposition: A | Discharge status: Home / Self Care | Payer: 59 | Source: Ambulatory Visit | Attending: Cardiology | Admitting: Cardiology

## 2015-10-17 ENCOUNTER — Ambulatory Visit
Admission: RE | Admit: 2015-10-17 | Discharge: 2015-10-17 | Disposition: A | Discharge status: Home / Self Care | Payer: 59 | Source: Ambulatory Visit

## 2015-10-17 DIAGNOSIS — K219 Gastro-esophageal reflux disease without esophagitis: Secondary | ICD-10-CM | POA: Diagnosis not present

## 2015-10-17 DIAGNOSIS — I712 Thoracic aortic aneurysm, without rupture: Secondary | ICD-10-CM | POA: Insufficient documentation

## 2015-10-17 DIAGNOSIS — I119 Hypertensive heart disease without heart failure: Secondary | ICD-10-CM | POA: Diagnosis not present

## 2015-10-17 DIAGNOSIS — I719 Aortic aneurysm of unspecified site, without rupture: Secondary | ICD-10-CM | POA: Diagnosis not present

## 2015-10-17 DIAGNOSIS — H353131 Nonexudative age-related macular degeneration, bilateral, early dry stage: Secondary | ICD-10-CM | POA: Diagnosis not present

## 2015-10-17 NOTE — Progress Notes (Signed)
*  PRELIMINARY RESULTS* Echocardiogram 2D Echocardiogram has been performed.  Jonathan Dixon 10/17/2015, 8:40 AM

## 2015-10-25 ENCOUNTER — Other Ambulatory Visit: Payer: Self-pay | Admitting: Cardiology

## 2015-10-25 DIAGNOSIS — R079 Chest pain, unspecified: Secondary | ICD-10-CM

## 2015-11-07 ENCOUNTER — Encounter (HOSPITAL_COMMUNITY): Payer: Self-pay

## 2015-11-07 ENCOUNTER — Ambulatory Visit (HOSPITAL_COMMUNITY)
Admission: RE | Admit: 2015-11-07 | Discharge: 2015-11-07 | Disposition: A | Discharge status: Home / Self Care | Payer: 59 | Source: Ambulatory Visit | Attending: Cardiology | Admitting: Cardiology

## 2015-11-07 DIAGNOSIS — I2510 Atherosclerotic heart disease of native coronary artery without angina pectoris: Secondary | ICD-10-CM | POA: Diagnosis not present

## 2015-11-07 DIAGNOSIS — J479 Bronchiectasis, uncomplicated: Secondary | ICD-10-CM | POA: Diagnosis not present

## 2015-11-07 DIAGNOSIS — I77810 Thoracic aortic ectasia: Secondary | ICD-10-CM | POA: Insufficient documentation

## 2015-11-07 DIAGNOSIS — R079 Chest pain, unspecified: Secondary | ICD-10-CM | POA: Diagnosis present

## 2015-11-07 LAB — CREATININE, SERUM
Creatinine, Ser: 0.84 mg/dL (ref 0.61–1.24)
GFR calc Af Amer: >60 mL/min (ref >60)
GFR calc non Af Amer: >60 mL/min (ref >60)

## 2015-11-07 MED ORDER — NITROGLYCERIN 0.4 MG SL SUBL
0.4000 mg | SUBLINGUAL_TABLET | SUBLINGUAL | Status: DC | PRN
  Filled 2015-11-07: qty 25

## 2015-11-07 MED ORDER — METOPROLOL TARTRATE 50 MG PO TABS
ORAL_TABLET | ORAL | Status: AC
  Filled 2015-11-07: qty 2

## 2015-11-07 MED ORDER — NITROGLYCERIN 0.4 MG SL SUBL
SUBLINGUAL_TABLET | SUBLINGUAL | Status: AC
  Administered 2015-11-07: 0.4 mg
  Filled 2015-11-07: qty 1

## 2015-11-07 MED ORDER — METOPROLOL TARTRATE 100 MG PO TABS
100.0000 mg | ORAL_TABLET | Freq: Once | ORAL | Status: AC
  Administered 2015-11-07: 50 mg via ORAL
  Filled 2015-11-07: qty 1

## 2015-11-07 MED ORDER — IOPAMIDOL (ISOVUE-370) INJECTION 76%
INTRAVENOUS | Status: AC
  Administered 2015-11-07: 80 mL
  Filled 2015-11-07: qty 100

## 2015-11-16 ENCOUNTER — Ambulatory Visit: Payer: 59

## 2015-11-21 ENCOUNTER — Telehealth: Payer: Self-pay | Admitting: *Deleted

## 2015-11-21 DIAGNOSIS — E039 Hypothyroidism, unspecified: Secondary | ICD-10-CM

## 2015-11-21 DIAGNOSIS — R5383 Other fatigue: Secondary | ICD-10-CM

## 2015-11-21 DIAGNOSIS — N400 Enlarged prostate without lower urinary tract symptoms: Secondary | ICD-10-CM

## 2015-11-21 DIAGNOSIS — E063 Autoimmune thyroiditis: Secondary | ICD-10-CM

## 2015-11-21 DIAGNOSIS — E559 Vitamin D deficiency, unspecified: Secondary | ICD-10-CM

## 2015-11-21 DIAGNOSIS — E349 Endocrine disorder, unspecified: Secondary | ICD-10-CM

## 2015-11-21 DIAGNOSIS — R7989 Other specified abnormal findings of blood chemistry: Secondary | ICD-10-CM

## 2015-11-21 DIAGNOSIS — Z13220 Encounter for screening for lipoid disorders: Secondary | ICD-10-CM

## 2015-11-21 NOTE — Telephone Encounter (Signed)
Labs to be drawn

## 2015-11-22 DIAGNOSIS — Z13220 Encounter for screening for lipoid disorders: Secondary | ICD-10-CM | POA: Diagnosis not present

## 2015-11-22 DIAGNOSIS — E349 Endocrine disorder, unspecified: Secondary | ICD-10-CM | POA: Diagnosis not present

## 2015-11-22 DIAGNOSIS — N400 Enlarged prostate without lower urinary tract symptoms: Secondary | ICD-10-CM | POA: Diagnosis not present

## 2015-11-22 DIAGNOSIS — E559 Vitamin D deficiency, unspecified: Secondary | ICD-10-CM | POA: Diagnosis not present

## 2015-11-22 DIAGNOSIS — E063 Autoimmune thyroiditis: Secondary | ICD-10-CM | POA: Diagnosis not present

## 2015-11-22 DIAGNOSIS — R5383 Other fatigue: Secondary | ICD-10-CM | POA: Diagnosis not present

## 2015-11-22 DIAGNOSIS — E039 Hypothyroidism, unspecified: Secondary | ICD-10-CM | POA: Diagnosis not present

## 2015-11-26 LAB — COMPREHENSIVE METABOLIC PANEL
ALT: 22 IU/L (ref 0–44)
AST: 25 IU/L (ref 0–40)
Albumin/Globulin Ratio: 2.4 — ABNORMAL HIGH (ref 1.2–2.2)
Albumin: 4.7 g/dL (ref 3.6–4.8)
Alkaline Phosphatase: 64 IU/L (ref 39–117)
BUN/Creatinine Ratio: 24 (ref 10–24)
BUN: 21 mg/dL (ref 8–27)
Bilirubin Total: 0.4 mg/dL (ref 0.0–1.2)
CO2: 22 mmol/L (ref 18–29)
Calcium: 9.5 mg/dL (ref 8.6–10.2)
Chloride: 102 mmol/L (ref 96–106)
Creatinine, Ser: 0.88 mg/dL (ref 0.76–1.27)
GFR calc Af Amer: 106 mL/min/{1.73_m2} (ref >59)
GFR calc non Af Amer: 92 mL/min/{1.73_m2} (ref >59)
Globulin, Total: 2 g/dL (ref 1.5–4.5)
Glucose: 101 mg/dL — ABNORMAL HIGH (ref 65–99)
Potassium: 4.4 mmol/L (ref 3.5–5.2)
Sodium: 141 mmol/L (ref 134–144)
Total Protein: 6.7 g/dL (ref 6.0–8.5)

## 2015-11-26 LAB — CBC
Hematocrit: 45 % (ref 37.5–51.0)
Hemoglobin: 15.8 g/dL (ref 12.6–17.7)
MCH: 31.4 pg (ref 26.6–33.0)
MCHC: 35.1 g/dL (ref 31.5–35.7)
MCV: 90 fL (ref 79–97)
Platelets: 266 10*3/uL (ref 150–379)
RBC: 5.03 x10E6/uL (ref 4.14–5.80)
RDW: 13.5 % (ref 12.3–15.4)
WBC: 6.6 10*3/uL (ref 3.4–10.8)

## 2015-11-26 LAB — PROGESTERONE: Progesterone: 0.1 ng/mL (ref 0.0–0.5)

## 2015-11-26 LAB — THYROID PEROXIDASE ANTIBODY: Thyroperoxidase Ab SerPl-aCnc: 32 IU/mL (ref 0–34)

## 2015-11-26 LAB — LIPID PANEL
Chol/HDL Ratio: 5.2 ratio units — ABNORMAL HIGH (ref 0.0–5.0)
Cholesterol, Total: 191 mg/dL (ref 100–199)
HDL: 37 mg/dL — ABNORMAL LOW (ref >39)
LDL Calculated: 140 mg/dL — ABNORMAL HIGH (ref 0–99)
Triglycerides: 72 mg/dL (ref 0–149)
VLDL Cholesterol Cal: 14 mg/dL (ref 5–40)

## 2015-11-26 LAB — VITAMIN D 1,25 DIHYDROXY
Vitamin D 1, 25 (OH)2 Total: 38 pg/mL
Vitamin D2 1, 25 (OH)2: <10 pg/mL
Vitamin D3 1, 25 (OH)2: 38 pg/mL

## 2015-11-26 LAB — THYROGLOBULIN ANTIBODY: Thyroglobulin Antibody: <1 IU/mL (ref 0.0–0.9)

## 2015-11-26 LAB — PSA TOTAL+% FREE (SERIAL)
PSA, Free Pct: 51.7 %
PSA, Free: 0.31 ng/mL
Prostate Specific Ag, Serum: 0.6 ng/mL (ref 0.0–4.0)

## 2015-11-26 LAB — TSH+T4F+T3FREE
Free T4: 1.24 ng/dL (ref 0.82–1.77)
T3, Free: 3.3 pg/mL (ref 2.0–4.4)
TSH: 1.79 u[IU]/mL (ref 0.450–4.500)

## 2015-11-26 LAB — TESTOSTERONE, FREE, TOTAL, SHBG
Sex Hormone Binding: 40.8 nmol/L (ref 19.3–76.4)
Testosterone, Free: 9.3 pg/mL (ref 6.6–18.1)
Testosterone: 395 ng/dL (ref 264–916)

## 2015-11-26 LAB — DHEA-SULFATE: DHEA-SO4: 128.4 ug/dL (ref 48.9–344.2)

## 2015-11-26 LAB — ESTRADIOL: Estradiol: 9.2 pg/mL (ref 7.6–42.6)

## 2015-11-27 ENCOUNTER — Ambulatory Visit (HOSPITAL_COMMUNITY): Payer: 59

## 2016-02-26 ENCOUNTER — Encounter: Payer: Self-pay | Admitting: Internal Medicine

## 2016-02-26 ENCOUNTER — Ambulatory Visit (INDEPENDENT_AMBULATORY_CARE_PROVIDER_SITE_OTHER): Payer: 59

## 2016-02-26 ENCOUNTER — Ambulatory Visit (INDEPENDENT_AMBULATORY_CARE_PROVIDER_SITE_OTHER): Payer: 59 | Admitting: Internal Medicine

## 2016-02-26 VITALS — BP 126/72 | HR 70 | Temp 97.4°F | Resp 16 | Ht 70.0 in | Wt 203.2 lb

## 2016-02-26 DIAGNOSIS — M546 Pain in thoracic spine: Secondary | ICD-10-CM | POA: Diagnosis not present

## 2016-02-26 DIAGNOSIS — R509 Fever, unspecified: Secondary | ICD-10-CM | POA: Diagnosis not present

## 2016-02-26 DIAGNOSIS — R5081 Fever presenting with conditions classified elsewhere: Secondary | ICD-10-CM

## 2016-02-26 DIAGNOSIS — R112 Nausea with vomiting, unspecified: Secondary | ICD-10-CM | POA: Diagnosis not present

## 2016-02-26 DIAGNOSIS — R059 Cough, unspecified: Secondary | ICD-10-CM

## 2016-02-26 DIAGNOSIS — J449 Chronic obstructive pulmonary disease, unspecified: Secondary | ICD-10-CM | POA: Diagnosis not present

## 2016-02-26 DIAGNOSIS — R6889 Other general symptoms and signs: Secondary | ICD-10-CM | POA: Insufficient documentation

## 2016-02-26 DIAGNOSIS — R05 Cough: Secondary | ICD-10-CM

## 2016-02-26 LAB — COMPREHENSIVE METABOLIC PANEL
ALT: 12 U/L (ref 0–53)
AST: 14 U/L (ref 0–37)
Albumin: 4.2 g/dL (ref 3.5–5.2)
Alkaline Phosphatase: 59 U/L (ref 39–117)
BUN: 20 mg/dL (ref 6–23)
CO2: 29 mEq/L (ref 19–32)
Calcium: 9.3 mg/dL (ref 8.4–10.5)
Chloride: 101 mEq/L (ref 96–112)
Creatinine, Ser: 0.89 mg/dL (ref 0.40–1.50)
GFR: 91.75 mL/min (ref >60.00)
Glucose, Bld: 106 mg/dL — ABNORMAL HIGH (ref 70–99)
Potassium: 4 mEq/L (ref 3.5–5.1)
Sodium: 138 mEq/L (ref 135–145)
Total Bilirubin: 0.7 mg/dL (ref 0.2–1.2)
Total Protein: 7 g/dL (ref 6.0–8.3)

## 2016-02-26 LAB — CBC WITH DIFFERENTIAL/PLATELET
Basophils Absolute: 0.1 10*3/uL (ref 0.0–0.1)
Basophils Relative: 0.4 % (ref 0.0–3.0)
Eosinophils Absolute: 0.2 10*3/uL (ref 0.0–0.7)
Eosinophils Relative: 1.6 % (ref 0.0–5.0)
HCT: 46.2 % (ref 39.0–52.0)
Hemoglobin: 15.9 g/dL (ref 13.0–17.0)
Lymphocytes Relative: 15.5 % (ref 12.0–46.0)
Lymphs Abs: 1.9 10*3/uL (ref 0.7–4.0)
MCHC: 34.5 g/dL (ref 30.0–36.0)
MCV: 91.2 fl (ref 78.0–100.0)
Monocytes Absolute: 0.9 10*3/uL (ref 0.1–1.0)
Monocytes Relative: 7 % (ref 3.0–12.0)
Neutro Abs: 9.3 10*3/uL — ABNORMAL HIGH (ref 1.4–7.7)
Neutrophils Relative %: 75.5 % (ref 43.0–77.0)
Platelets: 276 10*3/uL (ref 150.0–400.0)
RBC: 5.07 Mil/uL (ref 4.22–5.81)
RDW: 12.4 % (ref 11.5–15.5)
WBC: 12.3 10*3/uL — ABNORMAL HIGH (ref 4.0–10.5)

## 2016-02-26 LAB — POCT INFLUENZA A/B
Influenza A, POC: NEGATIVE
Influenza B, POC: NEGATIVE

## 2016-02-26 NOTE — Progress Notes (Signed)
Subjective:flu  Patient ID: Jonathan Dixon, male    DOB: 05/14/1950  Age: 66 y.o. MRN: [id number]  CC: The primary encounter diagnosis was Fever in other diseases. Diagnoses of Acute right-sided thoracic back pain, Cough in adult, Non-intractable vomiting with nausea, unspecified vomiting type, Flu-like symptoms, and Acute febrile illness were also pertinent to this visit.  HPI Jonathan Dixon presents for acute febrile illness   He has a history of a chronic cough that started in December 2016 and improved from productive to nonproductive after treatment with azithromycin. He underwent evaluation for 9 mm pleural based right sized pulmonary nodule by serial CTs with resolution by follow up CT in August.  Using robitussin,  Nonpurulent.,  [place]ent [place] for [place] holiday with extended family. Became acutely ill on  the boat on Dec 26th.  Multiple family members affected.  Nausea fevers,  Body aches.  Came home on Jan 2 in a convalescent state which started on Dec 30th.  By Jan 2 was feeling much better.  On Jan 3rd, wednesday  Started feeling overly tired again by nightfall , spent the rest of the night coughing .  lower back hurting terribly with cough. Thursday stayed in bed,   Anorexic.  Friday saw 2 surgical cases,  Started having high fevers  To 103 in spite of tylenol.  During the early hours of Saturday morning developed  horrible heartburn,  ,projectile vomiting, skin hurt.  No diarrhea or dysuria.  Started taking levaquin . Fever stopped within 1 hour of first dose and has been afebrile since Saturday evening,  Still very weak,  Body still aching , malaise.   Cough has largely resolved with the antibiotic ,  Not orthostatic by today's check   Outpatient Medications Prior to Visit  Medication Sig Dispense Refill  . acetaminophen (TYLENOL) 500 MG tablet Take 500 mg by mouth every 6 (six) hours as needed.    . benzonatate (TESSALON) 200 MG capsule Take 200 mg by mouth 3  (three) times daily as needed for cough.    Marland Kitchen guaiFENesin-dextromethorphan (ROBITUSSIN DM) 100-10 MG/5ML syrup Take 5 mLs by mouth every 4 (four) hours as needed for cough.    . Lansoprazole (PREVACID PO) Take 1 tablet by mouth daily. Take 15 mg daily    . valsartan-hydrochlorothiazide (DIOVAN-HCT) 160-25 MG tablet Take 1 tablet by mouth daily. 90 tablet 3  . fluticasone (FLONASE) 50 MCG/ACT nasal spray Place 1-2 sprays into both nostrils daily. 16 g 6  . loratadine (CLARITIN) 10 MG tablet Take 1 tablet (10 mg total) by mouth daily.     No facility-administered medications prior to visit.     Review of Systems;  Patient denies headache, fevers, malaise, unintentional weight loss, skin rash, eye pain, sinus congestion and sinus pain, sore throat, dysphagia,  hemoptysis , cough, dyspnea, wheezing, chest pain, palpitations, orthopnea, edema, abdominal pain, nausea, melena, diarrhea, constipation, flank pain, dysuria, hematuria, urinary  Frequency, nocturia, numbness, tingling, seizures,  Focal weakness, Loss of consciousness,  Tremor, insomnia, depression, anxiety, and suicidal ideation.      Objective:  BP 126/72   Pulse 70   Temp 97.4 F (36.3 C) (Oral)   Resp 16   Ht 5\' 10"  (1.778 m)   Wt 203 lb 4 oz (92.2 kg)   SpO2 96%   BMI 29.16 kg/m   BP Readings from Last 3 Encounters:  02/26/16 126/72  11/07/15 (!) 171/104  06/05/15 (!) 158/96    Wt Readings  from Last 3 Encounters:  02/26/16 203 lb 4 oz (92.2 kg)  11/07/15 201 lb (91.2 kg)  06/05/15 201 lb (91.2 kg)    General appearance: alert, cooperative and appears stated age Ears: normal TM's and external ear canals both ears Throat: lips, mucosa, and tongue normal; teeth and gums normal Neck: no adenopathy, no carotid bruit, supple, symmetrical, trachea midline and thyroid not enlarged, symmetric, no tenderness/mass/nodules Back: symmetric, no curvature. ROM normal. No CVA tenderness. Lungs: clear to auscultation  bilaterally Heart: regular rate and rhythm, S1, S2 normal, no murmur, click, rub or gallop Abdomen: soft, non-tender; bowel sounds normal; no masses,  no organomegaly Pulses: 2+ and symmetric Skin: Skin color, texture, turgor normal. No rashes or lesions Lymph nodes: Cervical, supraclavicular, and axillary nodes normal.  No results found for: HGBA1C  Lab Results  Component Value Date   CREATININE 0.89 02/26/2016   CREATININE 0.88 11/22/2015   CREATININE 0.84 11/07/2015    Lab Results  Component Value Date   WBC 12.3 (H) 02/26/2016   HGB 15.9 02/26/2016   HCT 46.2 02/26/2016   PLT 276.0 02/26/2016   GLUCOSE 106 (H) 02/26/2016   CHOL 191 11/22/2015   TRIG 72 11/22/2015   HDL 37 (L) 11/22/2015   LDLCALC 140 (H) 11/22/2015   ALT 12 02/26/2016   AST 14 02/26/2016   NA 138 02/26/2016   K 4.0 02/26/2016   CL 101 02/26/2016   CREATININE 0.89 02/26/2016   BUN 20 02/26/2016   CO2 29 02/26/2016   TSH 1.790 11/22/2015   PSA 0.6 05/18/2014    Ct Coronary Morph W/cta Cor W/score W/ca W/cm &/or Wo/cm  Result Date: 11/07/2015 CLINICAL DATA:  66 year old male with history of chest pain. MEDICATIONS: Lopressor:  100  mg, P.O. Nitroglycerin 400 mcg, sublingual EXAM: CT ANGIOGRAPHY OF THE HEART, CORONARY ARTERY, STRUCTURE, AND MORPHOLOGY CT HEART FOR CALCIUM SCORING PROCEDURE: CT angiography of the coronary vessels was performed on a 256 channel system using prospective ECG gating. A scout and ECG-gated noncontrast exam (for calcium scoring) were performed. Appropriate delay was determined by bolus tracking after injection of iodinated contrast, and an ECG-gated coronary CTA was performed with sub-mm slice collimation during late diastole. Imaging post processing was performed on an independent workstation creating multiplanar and 3-D images, allowing for quantitative analysis of the heart and coronary arteries. Note that this exam targets the heart and the chest was not imaged in its entirety.  TECHNIQUE: CT heart was performed on a 256 channel system using prospective ECG gating. A scout and noncontrast exam (for calcium scoring) were performed. Note that this exam targets the heart and the chest was not imaged in its entirety. COMPARISON:  Chest CT 10/13/2015. FINDINGS: Technical quality:  Excellent Heart rate:  50 bpm CORONARY ARTERIES: Left Main:  CAD-RADS 1 (1-24% stenosis). LAD: CAD-RADS 1 (1-24% stenosis). Some eccentric positive remodeling and low attenuation nonobstructive plaque in proximal LAD. Diagonals:  CAD-RADS 1 (1-24% stenosis). Ramus Intermedius:  CAD-RADS 2 (25-49% stenosis) proximally. LCx:  CAD-RADS 2 (25-49% stenosis) proximally. OMs:  CAD-RADS 1 (1-24% stenosis). RCA:  CAD-RADS 1 (1-24% stenosis). PDA:  Patent. Dominance:  Right. CORONARY CALCIUM: Total Agatston Score:  127 MESA database percentile:  67th AORTA AND PULMONARY MEASUREMENTS: Aortic root (21 - 40 mm): 27  mm at the annulus 38  mm at the sinuses of Valsalva 28  mm at the sinotubular junction Ascending aorta: (< 40 mm):  40 mm Descending aorta:  (< 40 mm):  27 mm Main  pulmonary artery: (< 30 mm):  29 mm EXTRACARDIAC FINDINGS: Within the visualized portions of the thorax there are some scattered areas of mild cylindrical bronchiectasis, most evident in the basal segments of the right lower lobe and in the medial segment of the right middle lobe, where there are areas of linear scarring. There are no definite suspicious appearing pulmonary nodules or masses, there is no acute consolidative airspace disease, no pleural effusions, no pneumothorax and no lymphadenopathy. Visualized portions of the upper abdomen are unremarkable. No aggressive appearing lytic or blastic lesions are noted in the visualized portions of the skeleton. IMPRESSION: 1. The patient does nonobstructive coronary artery disease (up to CAD-RADS 2 (25-49% stenosis) in left circumflex and ramus intermedius coronary arteries, as detailed above. 2. In addition,  there is some positive remodeling and low-attenuation plaque in the proximal LAD. Please note that positive remodeling and low attenuation plaque (thought to be a surrogate for lipid-laden plaque) have been shown to be strong markers of coronary artery lesions that are at higher risk of plaque rupture which may result in acute coronary syndrome (reference: Motoyama Set. al., Margaretha Sheffield [id number]) in the next 2-3 years. Appropriate referral for medical management of coronary artery disease is highly recommended. 3. Patient's total coronary artery calcium score is 127 which is sixty-seventh percentile for patient's of matched age, gender and race/ethnicity. 4. Ectasia of ascending thoracic aorta (4.0 cm in diameter). Recommend annual imaging followup by CTA or MRA. This recommendation follows 2010 ACCF/AHA/AATS/ACR/ASA/SCA/SCAI/SIR/STS/SVM Guidelines for the Diagnosis and Management of Patients with Thoracic Aortic Disease. Circulation. 2010; 121: [id number]. 5. Scattered areas of mild cylindrical bronchiectasis and scarring are again noted in the lungs bilaterally, as above. Electronically Signed   By: Vinnie Langton M.D.   On: 11/07/2015 11:18    Assessment & Plan:   Problem List Items Addressed This Visit    Acute febrile illness    Etiology unclear,  Chest x ray was normal., rapid flu screen was negative.UA and Urine culture were negative.   Mild leukocytosis with left shift 2 days post initiation of empiric levaquin with significant improvement after inititiaton of antibiotic use.        Flu-like symptoms    Suspect he had the flu , complicated by bacterial pneumonia since fevers quickly abated after first levaquin dose.         Other Visit Diagnoses    Fever in other diseases    -  Primary   Relevant Orders   POCT Influenza A/B (Completed)   CBC with Differential/Platelet (Completed)   DG Chest 2 View (Completed)   Acute right-sided thoracic back pain       Relevant Orders   Urinalysis,  Routine w reflex microscopic (Completed)   POCT urinalysis dipstick   Urine culture (Completed)   Cough in adult       Relevant Orders   POCT Influenza A/B (Completed)   CBC with Differential/Platelet (Completed)   DG Chest 2 View (Completed)   Non-intractable vomiting with nausea, unspecified vomiting type       Relevant Orders   Comprehensive metabolic panel (Completed)      I have discontinued Mr. Jobson fluticasone and loratadine. I am also having him maintain his Lansoprazole (PREVACID PO), acetaminophen, valsartan-hydrochlorothiazide, guaiFENesin-dextromethorphan, benzonatate, and levofloxacin.  Meds ordered this encounter  Medications  . levofloxacin (LEVAQUIN) 750 MG tablet    Sig: Take 750 mg by mouth daily.    Medications Discontinued During This Encounter  Medication Reason  . fluticasone (FLONASE)  50 MCG/ACT nasal spray No longer needed (for PRN medications)  . loratadine (CLARITIN) 10 MG tablet No longer needed (for PRN medications)    Follow-up: No Follow-up on file.   Crecencio Mc, MD

## 2016-02-26 NOTE — Assessment & Plan Note (Signed)
Suspect he had the flu , complicated by bacterial pneumonia since fevers quickly abated after first levaquin dose.

## 2016-02-26 NOTE — Progress Notes (Signed)
Pre-visit discussion using our clinic review tool. No additional management support is needed unless otherwise documented below in the visit note.  

## 2016-02-27 ENCOUNTER — Other Ambulatory Visit: Payer: 59

## 2016-02-27 ENCOUNTER — Encounter: Payer: Self-pay | Admitting: Internal Medicine

## 2016-02-27 DIAGNOSIS — R509 Fever, unspecified: Secondary | ICD-10-CM | POA: Insufficient documentation

## 2016-02-27 LAB — URINALYSIS, ROUTINE W REFLEX MICROSCOPIC
Bilirubin Urine: NEGATIVE
Hgb urine dipstick: NEGATIVE
Ketones, ur: NEGATIVE
Leukocytes, UA: NEGATIVE
Nitrite: NEGATIVE
RBC / HPF: NONE SEEN (ref 0–?)
Specific Gravity, Urine: >=1.03 — AB (ref 1.000–1.030)
Total Protein, Urine: NEGATIVE
Urine Glucose: NEGATIVE
Urobilinogen, UA: 1 (ref 0.0–1.0)
WBC, UA: NONE SEEN (ref 0–?)
pH: 5.5 (ref 5.0–8.0)

## 2016-02-27 LAB — URINE CULTURE: Organism ID, Bacteria: NO GROWTH

## 2016-02-27 NOTE — Assessment & Plan Note (Addendum)
Etiology unclear,  Chest x ray was normal., rapid flu screen was negative.UA and Urine culture were negative.   Mild leukocytosis with left shift 2 days post initiation of empiric levaquin with significant improvement after inititiaton of antibiotic use.

## 2016-04-08 ENCOUNTER — Other Ambulatory Visit: Payer: Self-pay | Admitting: Family Medicine

## 2016-05-12 ENCOUNTER — Encounter: Payer: Self-pay | Admitting: Family Medicine

## 2016-05-20 ENCOUNTER — Telehealth: Payer: Self-pay | Admitting: General Surgery

## 2016-05-23 NOTE — Telephone Encounter (Signed)
ERROR

## 2016-06-04 DIAGNOSIS — M19031 Primary osteoarthritis, right wrist: Secondary | ICD-10-CM | POA: Diagnosis not present

## 2016-07-09 ENCOUNTER — Other Ambulatory Visit: Payer: Self-pay | Admitting: Family Medicine

## 2016-08-02 ENCOUNTER — Ambulatory Visit (INDEPENDENT_AMBULATORY_CARE_PROVIDER_SITE_OTHER): Payer: 59 | Admitting: Family Medicine

## 2016-08-02 ENCOUNTER — Encounter: Payer: Self-pay | Admitting: Family Medicine

## 2016-08-02 VITALS — BP 142/84 | HR 63 | Temp 97.8°F | Ht 70.0 in | Wt 202.8 lb

## 2016-08-02 DIAGNOSIS — Z0000 Encounter for general adult medical examination without abnormal findings: Secondary | ICD-10-CM

## 2016-08-02 DIAGNOSIS — I10 Essential (primary) hypertension: Secondary | ICD-10-CM

## 2016-08-02 DIAGNOSIS — E781 Pure hyperglyceridemia: Secondary | ICD-10-CM

## 2016-08-02 MED ORDER — VALSARTAN-HYDROCHLOROTHIAZIDE 160-25 MG PO TABS: 1.0000 | ORAL_TABLET | Freq: Every day | ORAL | 3 refills | Status: DC

## 2016-08-02 MED ORDER — ROSUVASTATIN CALCIUM 5 MG PO TABS: 5.0000 mg | ORAL_TABLET | Freq: Every day | ORAL | 3 refills | Status: DC

## 2016-08-02 NOTE — Progress Notes (Signed)
CPE- See plan.  Routine anticipatory guidance given to patient.  See health maintenance.  The possibility exists that previously documented standard health maintenance information may have been brought forward from a previous encounter into this note.  If needed, that same information has been updated to reflect the current situation based on today's encounter.    Tetanus  2015 Flu shot done at work Shingles 2015, d/w pt.  Currently shingrix is backordered.  PNA not due.  D/w pt.   HIV and HCV prev neg.   Living will d/w pt.  Wife designated if patient were incapacitated.   Diet and exercise d/w pt. Colonoscopy 2015   PSA wnl 2017  Hypertension:    Using medication without problems or lightheadedness: yes Chest pain with exertion:no Edema:no Short of breath:no Average home BPs: similar to today.   He had been eating a little more salt recently.   D/w pt about statin start.  D/w pt about diet and exercise.   Prev CT scoring, cards eval d/w pt.  He had PVCs prev.    He had wrist injection done per ortho.  He tried taking mobic vs aleve, but had inc in BP and BLE edema with that.  Would avoid/limit oral nsaids in general, d/w pt.  Splinting helps.    Going to sleep well but waking in the middle of the night.  Not having nocturia.  occ taking benadryl prn, worse in the last 6 months.  He isn't worried or recapping the day.  He isn't taking as much call as prev.  D/w pt about diet and exercise.  Minimal etoh use.   PMH and SH reviewed  Meds, vitals, and allergies reviewed.   ROS: Per HPI.  Unless specifically indicated otherwise in HPI, the patient denies:  General: fever. Eyes: acute vision changes ENT: sore throat Cardiovascular: chest pain Respiratory: SOB GI: vomiting GU: dysuria Musculoskeletal: acute back pain Derm: acute rash Neuro: acute motor dysfunction Psych: worsening mood Endocrine: polydipsia Heme: bleeding Allergy: hayfever  GEN: nad, alert and  oriented HEENT: mucous membranes moist NECK: supple w/o LA CV: rrr. PULM: ctab, no inc wob ABD: soft, +bs EXT: no edema SKIN: no acute rash

## 2016-08-02 NOTE — Patient Instructions (Signed)
Recheck labs in about 3 months.   Start crestor in the meantime.  Try to add on some exercise.  Take care.  Glad to see you.

## 2016-08-04 NOTE — Assessment & Plan Note (Addendum)
Tetanus  2015 Flu shot done at work Shingles 2015, d/w pt.  Currently shingrix is backordered.  PNA not due.  D/w pt.   HIV and HCV prev neg.   Living will d/w pt.  Wife designated if patient were incapacitated.   Diet and exercise d/w pt. Colonoscopy 2015   PSA wnl 2017 He will update me if his joint pain is worse. Discussed with patient about sleep, reasonable to use Benadryl as needed and work on exercise during the day.

## 2016-08-04 NOTE — Assessment & Plan Note (Signed)
No change in meds. Continue work on diet and exercise. He agrees. See below about labs.

## 2016-08-04 NOTE — Assessment & Plan Note (Signed)
Add on statin with routine cautions. Update me as needed. He agrees. Recheck labs, cmet and lipid, in a few months. Order given to patient.

## 2016-10-17 DIAGNOSIS — D2262 Melanocytic nevi of left upper limb, including shoulder: Secondary | ICD-10-CM | POA: Diagnosis not present

## 2016-10-17 DIAGNOSIS — L570 Actinic keratosis: Secondary | ICD-10-CM | POA: Diagnosis not present

## 2016-10-17 DIAGNOSIS — D485 Neoplasm of uncertain behavior of skin: Secondary | ICD-10-CM | POA: Diagnosis not present

## 2016-10-17 DIAGNOSIS — D225 Melanocytic nevi of trunk: Secondary | ICD-10-CM | POA: Diagnosis not present

## 2016-10-17 DIAGNOSIS — Z85820 Personal history of malignant melanoma of skin: Secondary | ICD-10-CM | POA: Diagnosis not present

## 2016-10-17 DIAGNOSIS — C44519 Basal cell carcinoma of skin of other part of trunk: Secondary | ICD-10-CM | POA: Diagnosis not present

## 2016-10-17 DIAGNOSIS — Z85828 Personal history of other malignant neoplasm of skin: Secondary | ICD-10-CM | POA: Diagnosis not present

## 2016-10-17 DIAGNOSIS — X32XXXA Exposure to sunlight, initial encounter: Secondary | ICD-10-CM | POA: Diagnosis not present

## 2016-11-12 ENCOUNTER — Other Ambulatory Visit: Payer: Self-pay | Admitting: Family Medicine

## 2016-11-12 DIAGNOSIS — I10 Essential (primary) hypertension: Secondary | ICD-10-CM | POA: Diagnosis not present

## 2016-11-13 LAB — COMPREHENSIVE METABOLIC PANEL
ALT: 26 IU/L (ref 0–44)
AST: 24 IU/L (ref 0–40)
Albumin/Globulin Ratio: 2.4 — ABNORMAL HIGH (ref 1.2–2.2)
Albumin: 4.7 g/dL (ref 3.6–4.8)
Alkaline Phosphatase: 61 IU/L (ref 39–117)
BUN/Creatinine Ratio: 24 (ref 10–24)
BUN: 19 mg/dL (ref 8–27)
Bilirubin Total: 0.7 mg/dL (ref 0.0–1.2)
CO2: 25 mmol/L (ref 20–29)
Calcium: 9.9 mg/dL (ref 8.6–10.2)
Chloride: 103 mmol/L (ref 96–106)
Creatinine, Ser: 0.79 mg/dL (ref 0.76–1.27)
GFR calc Af Amer: 110 mL/min/{1.73_m2} (ref >59)
GFR calc non Af Amer: 96 mL/min/{1.73_m2} (ref >59)
Globulin, Total: 2 g/dL (ref 1.5–4.5)
Glucose: 102 mg/dL — ABNORMAL HIGH (ref 65–99)
Potassium: 3.9 mmol/L (ref 3.5–5.2)
Sodium: 143 mmol/L (ref 134–144)
Total Protein: 6.7 g/dL (ref 6.0–8.5)

## 2016-11-13 LAB — LIPID PANEL WITH LDL/HDL RATIO
Cholesterol, Total: 158 mg/dL (ref 100–199)
HDL: 44 mg/dL (ref >39)
LDL Calculated: 91 mg/dL (ref 0–99)
LDl/HDL Ratio: 2.1 ratio (ref 0.0–3.6)
Triglycerides: 113 mg/dL (ref 0–149)
VLDL Cholesterol Cal: 23 mg/dL (ref 5–40)

## 2016-11-14 ENCOUNTER — Encounter: Payer: Self-pay | Admitting: Family Medicine

## 2016-11-18 ENCOUNTER — Encounter: Payer: Self-pay | Admitting: Family Medicine

## 2016-11-18 LAB — LAB REPORT - SCANNED
ALT: 26
AST: 24
Cholesterol, Total: 158
Creatinine, Ser: 0.79
Glucose: 102
HDL Cholesterol: 44
LDL Cholesterol (Calc): 91
Triglycerides: 113

## 2016-11-22 ENCOUNTER — Encounter: Payer: Self-pay | Admitting: Family Medicine

## 2017-01-14 DIAGNOSIS — M5432 Sciatica, left side: Secondary | ICD-10-CM | POA: Diagnosis not present

## 2017-01-14 DIAGNOSIS — M21372 Foot drop, left foot: Secondary | ICD-10-CM | POA: Diagnosis not present

## 2017-02-17 ENCOUNTER — Telehealth: Payer: Self-pay | Admitting: Family Medicine

## 2017-02-17 MED ORDER — DOXYCYCLINE HYCLATE 100 MG PO TABS: 100.0000 mg | ORAL_TABLET | Freq: Two times a day (BID) | ORAL | 0 refills | Status: DC

## 2017-02-17 NOTE — Telephone Encounter (Signed)
Copied from [place] [id number]. Topic: Quick Communication - See Telephone Encounter >> Feb 17, 2017 10:48 AM Robina Ade, Helene Kelp D wrote: CRM for notification. See Telephone encounter for: 02/17/17. Dr. Bary Castilla called and wanted to know if Dr. Damita Dunnings call him him in something for possible bronchitis. He has had a cough, green mucus for 6 days. Please call patient back [phone].

## 2017-02-17 NOTE — Telephone Encounter (Signed)
Doxy sent to [facility].  Update [place] if not better.  Thanks.

## 2017-02-17 NOTE — Telephone Encounter (Signed)
Patient advised.

## 2017-03-06 DIAGNOSIS — H353131 Nonexudative age-related macular degeneration, bilateral, early dry stage: Secondary | ICD-10-CM | POA: Diagnosis not present

## 2017-03-06 DIAGNOSIS — H26491 Other secondary cataract, right eye: Secondary | ICD-10-CM | POA: Diagnosis not present

## 2017-04-06 ENCOUNTER — Encounter: Payer: Self-pay | Admitting: Family Medicine

## 2017-07-22 ENCOUNTER — Other Ambulatory Visit: Payer: Self-pay | Admitting: Family Medicine

## 2017-08-07 ENCOUNTER — Other Ambulatory Visit: Payer: Self-pay | Admitting: Family Medicine

## 2017-08-07 NOTE — Telephone Encounter (Signed)
Electronic refill request. Valsartan/HCT 160-25 Last office visit:   08/02/16 CPE  No upcoming appts scheduled Last Filled:    90 tablet 3 08/02/2016  Should patient schedule CPE?

## 2017-08-08 ENCOUNTER — Encounter: Payer: Self-pay | Admitting: *Deleted

## 2017-08-08 NOTE — Telephone Encounter (Signed)
Letter mailed

## 2017-08-08 NOTE — Telephone Encounter (Signed)
CPE when possible.  Sent. Thanks.

## 2017-08-19 ENCOUNTER — Encounter: Payer: Self-pay | Admitting: *Deleted

## 2017-09-03 ENCOUNTER — Other Ambulatory Visit: Payer: Self-pay | Admitting: Cardiology

## 2017-09-03 DIAGNOSIS — R002 Palpitations: Secondary | ICD-10-CM | POA: Diagnosis not present

## 2017-09-03 DIAGNOSIS — R079 Chest pain, unspecified: Secondary | ICD-10-CM

## 2017-09-04 LAB — LAB REPORT - SCANNED
ALT: 27
AST: 23
Cholesterol: 140
Creatinine, Ser: 0.89
Glucose: 101
HDL: 43
Hemoglobin: 14.5
LDL (calc): 80
Potassium: 4.3
Prostate Specific Ag, Serum: 0.7
TSH: 2.15
Triglycerides: 83

## 2017-09-09 DIAGNOSIS — R002 Palpitations: Secondary | ICD-10-CM | POA: Diagnosis not present

## 2017-09-09 DIAGNOSIS — I493 Ventricular premature depolarization: Secondary | ICD-10-CM | POA: Diagnosis not present

## 2017-09-16 ENCOUNTER — Encounter: Payer: Self-pay | Admitting: Family Medicine

## 2017-09-16 ENCOUNTER — Other Ambulatory Visit: Payer: Self-pay | Admitting: Cardiology

## 2017-09-16 ENCOUNTER — Ambulatory Visit (INDEPENDENT_AMBULATORY_CARE_PROVIDER_SITE_OTHER): Payer: 59 | Admitting: Family Medicine

## 2017-09-16 VITALS — BP 130/82 | HR 64 | Temp 97.7°F | Ht 70.0 in | Wt 206.0 lb

## 2017-09-16 DIAGNOSIS — I10 Essential (primary) hypertension: Secondary | ICD-10-CM

## 2017-09-16 DIAGNOSIS — Z7189 Other specified counseling: Secondary | ICD-10-CM

## 2017-09-16 DIAGNOSIS — Z0000 Encounter for general adult medical examination without abnormal findings: Secondary | ICD-10-CM

## 2017-09-16 DIAGNOSIS — E781 Pure hyperglyceridemia: Secondary | ICD-10-CM

## 2017-09-16 MED ORDER — VALSARTAN-HYDROCHLOROTHIAZIDE 160-25 MG PO TABS
1.0000 | ORAL_TABLET | Freq: Every day | ORAL | 3 refills | Status: DC
Start: 2017-09-16 — End: 2018-11-18

## 2017-09-16 MED ORDER — ROSUVASTATIN CALCIUM 5 MG PO TABS: 5.0000 mg | ORAL_TABLET | Freq: Every day | ORAL | 3 refills | Status: DC

## 2017-09-16 NOTE — Patient Instructions (Signed)
Don't change your meds for now.  I'll await the cardiology report.  I would get a flu shot each fall.   Take care.  Glad to see you.  Update me as needed.

## 2017-09-16 NOTE — Progress Notes (Signed)
CPE- See plan.  Routine anticipatory guidance given to patient.  See health maintenance.  The possibility exists that previously documented standard health maintenance information may have been brought forward from a previous encounter into this note.  If needed, that same information has been updated to reflect the current situation based on today's encounter.    Tetanus 2015 Flu shot done at work Shingles 2015, d/w pt.  Currently shingrix is backordered.  PNA not due. D/w pt.  HIV and HCV prev neg.  Living will d/w pt. Wife designated if patient were incapacitated.  Diet and exercise d/w pt.  He is biking 3 times a week.   Colonoscopy 2015  PSA wnl 2019  He has f/u with derm pending.  I'll defer.  He agrees.    Elevated Cholesterol: Using medications without problems:yes Muscle aches: no Diet compliance:encouraged.  Exercise:encouraged.  He is putting up with joint aches with hand/wrist OA that wasn't attributed to the statin use.  Labs d/w pt.   He had a few episodes with some mild sweating at night, only on the neck.  It resolved in the meantime.  No other B sx, no other FCNAVD.  No cough.  No sx now.  We agreed if sx return he'll update me.    Hypertension:    Using medication without problems or lightheadedness: yes Chest pain with exertion: see below.  Edema:no Short of breath:no  H/o chest pain-etiology unclear. Pre cards note- Stress test was unremarkable for significant ischemia. There was some ventricular ectopy during onset of exercise which resolved into exercise. Cardiac CT revealed no significant abnormalities. His PVCs have increased. They seem to worsen with activity. Will place a 48-hour Holter monitor and recommend a adenosine stress cardiac MR to evaluate for possible structural abnormalities versus ischemia versus myocarditis to guide further therapy regarding his ectopy.    No CP now.  I'll defer to cards.  He had holter placed with report pending.  Pt  agrees.    PMH and SH reviewed  Meds, vitals, and allergies reviewed.   ROS: Per HPI.  Unless specifically indicated otherwise in HPI, the patient denies:  General: fever. Eyes: acute vision changes ENT: sore throat Cardiovascular: chest pain Respiratory: SOB GI: vomiting GU: dysuria Musculoskeletal: acute back pain Derm: acute rash Neuro: acute motor dysfunction Psych: worsening mood Endocrine: polydipsia Heme: bleeding Allergy: hayfever  GEN: nad, alert and oriented HEENT: mucous membranes moist NECK: supple w/o LA CV: RRR with occ ectopy noted.  PULM: ctab, no inc wob ABD: soft, +bs EXT: no edema SKIN: no acute rash

## 2017-09-17 NOTE — Assessment & Plan Note (Signed)
No change in medication at this point.  Chest pain-free currently.  I will defer to cardiology about his ongoing work-up.  He was previously intolerant of propranolol.  Unclear if he will be able to tolerate or benefit from metoprolol.  I will defer to cardiology.  Patient agrees with all that.

## 2017-09-17 NOTE — Assessment & Plan Note (Signed)
Living will d/w pt.  Wife designated if patient were incapacitated.   ?

## 2017-09-17 NOTE — Assessment & Plan Note (Signed)
Labs discussed with patient.  Continue statin.  He agrees.

## 2017-09-17 NOTE — Assessment & Plan Note (Signed)
Tetanus 2015 Flu shot done at work Shingles 2015, d/w pt.  Currently shingrix is backordered.  PNA not due. D/w pt.  HIV and HCV prev neg.  Living will d/w pt. Wife designated if patient were incapacitated.  Diet and exercise d/w pt.  He is biking 3 times a week.   Colonoscopy 2015  PSA wnl 2019

## 2017-09-30 ENCOUNTER — Other Ambulatory Visit (HOSPITAL_COMMUNITY): Payer: 59

## 2017-10-07 ENCOUNTER — Ambulatory Visit (HOSPITAL_COMMUNITY)
Admission: RE | Admit: 2017-10-07 | Discharge: 2017-10-07 | Disposition: A | Discharge status: Home / Self Care | Payer: 59 | Source: Ambulatory Visit | Attending: Cardiology | Admitting: Cardiology

## 2017-10-07 DIAGNOSIS — R079 Chest pain, unspecified: Secondary | ICD-10-CM

## 2017-10-07 DIAGNOSIS — I081 Rheumatic disorders of both mitral and tricuspid valves: Secondary | ICD-10-CM | POA: Insufficient documentation

## 2017-10-07 DIAGNOSIS — I517 Cardiomegaly: Secondary | ICD-10-CM | POA: Insufficient documentation

## 2017-10-07 DIAGNOSIS — I2510 Atherosclerotic heart disease of native coronary artery without angina pectoris: Secondary | ICD-10-CM | POA: Insufficient documentation

## 2017-10-07 DIAGNOSIS — I288 Other diseases of pulmonary vessels: Secondary | ICD-10-CM | POA: Insufficient documentation

## 2017-10-07 DIAGNOSIS — I472 Ventricular tachycardia: Secondary | ICD-10-CM | POA: Diagnosis not present

## 2017-10-07 MED ORDER — GADOBENATE DIMEGLUMINE 529 MG/ML IV SOLN
40.0000 mL | Freq: Once | INTRAVENOUS | Status: AC | PRN
  Administered 2017-10-07: 40 mL via INTRAVENOUS

## 2017-10-07 MED ORDER — REGADENOSON 0.4 MG/5ML IV SOLN
INTRAVENOUS | Status: AC
  Filled 2017-10-07: qty 5

## 2017-10-23 DIAGNOSIS — D1724 Benign lipomatous neoplasm of skin and subcutaneous tissue of left leg: Secondary | ICD-10-CM | POA: Diagnosis not present

## 2017-10-23 DIAGNOSIS — Z85820 Personal history of malignant melanoma of skin: Secondary | ICD-10-CM | POA: Diagnosis not present

## 2017-10-23 DIAGNOSIS — D225 Melanocytic nevi of trunk: Secondary | ICD-10-CM | POA: Diagnosis not present

## 2017-10-23 DIAGNOSIS — Z08 Encounter for follow-up examination after completed treatment for malignant neoplasm: Secondary | ICD-10-CM | POA: Diagnosis not present

## 2017-10-23 DIAGNOSIS — D2262 Melanocytic nevi of left upper limb, including shoulder: Secondary | ICD-10-CM | POA: Diagnosis not present

## 2017-10-23 DIAGNOSIS — D2271 Melanocytic nevi of right lower limb, including hip: Secondary | ICD-10-CM | POA: Diagnosis not present

## 2017-10-23 DIAGNOSIS — D2272 Melanocytic nevi of left lower limb, including hip: Secondary | ICD-10-CM | POA: Diagnosis not present

## 2017-10-23 DIAGNOSIS — Z85828 Personal history of other malignant neoplasm of skin: Secondary | ICD-10-CM | POA: Diagnosis not present

## 2017-10-23 DIAGNOSIS — D1723 Benign lipomatous neoplasm of skin and subcutaneous tissue of right leg: Secondary | ICD-10-CM | POA: Diagnosis not present

## 2017-12-03 ENCOUNTER — Ambulatory Visit: Payer: Self-pay | Admitting: General Surgery

## 2017-12-08 DIAGNOSIS — I10 Essential (primary) hypertension: Secondary | ICD-10-CM | POA: Diagnosis not present

## 2018-02-04 ENCOUNTER — Other Ambulatory Visit: Payer: Self-pay | Admitting: Internal Medicine

## 2018-02-04 ENCOUNTER — Other Ambulatory Visit (INDEPENDENT_AMBULATORY_CARE_PROVIDER_SITE_OTHER): Payer: 59

## 2018-02-04 DIAGNOSIS — Z79899 Other long term (current) drug therapy: Secondary | ICD-10-CM | POA: Diagnosis not present

## 2018-02-04 LAB — CBC WITH DIFFERENTIAL/PLATELET
Basophils Absolute: 0.1 10*3/uL (ref 0.0–0.1)
Basophils Relative: 0.8 % (ref 0.0–3.0)
Eosinophils Absolute: 0.1 10*3/uL (ref 0.0–0.7)
Eosinophils Relative: 1.6 % (ref 0.0–5.0)
HCT: 46.9 % (ref 39.0–52.0)
Hemoglobin: 16.2 g/dL (ref 13.0–17.0)
Lymphocytes Relative: 24.1 % (ref 12.0–46.0)
Lymphs Abs: 2.1 10*3/uL (ref 0.7–4.0)
MCHC: 34.5 g/dL (ref 30.0–36.0)
MCV: 91.3 fl (ref 78.0–100.0)
Monocytes Absolute: 0.6 10*3/uL (ref 0.1–1.0)
Monocytes Relative: 6.9 % (ref 3.0–12.0)
Neutro Abs: 5.9 10*3/uL (ref 1.4–7.7)
Neutrophils Relative %: 66.6 % (ref 43.0–77.0)
Platelets: 282 10*3/uL (ref 150.0–400.0)
RBC: 5.13 Mil/uL (ref 4.22–5.81)
RDW: 14.2 % (ref 11.5–15.5)
WBC: 8.8 10*3/uL (ref 4.0–10.5)

## 2018-02-04 LAB — COMPREHENSIVE METABOLIC PANEL
ALT: 23 U/L (ref 0–53)
AST: 21 U/L (ref 0–37)
Albumin: 4.8 g/dL (ref 3.5–5.2)
Alkaline Phosphatase: 58 U/L (ref 39–117)
BUN: 26 mg/dL — ABNORMAL HIGH (ref 6–23)
CO2: 29 mEq/L (ref 19–32)
Calcium: 10.1 mg/dL (ref 8.4–10.5)
Chloride: 103 mEq/L (ref 96–112)
Creatinine, Ser: 0.93 mg/dL (ref 0.40–1.50)
GFR: 86.68 mL/min (ref >60.00)
Glucose, Bld: 123 mg/dL — ABNORMAL HIGH (ref 70–99)
Potassium: 4 mEq/L (ref 3.5–5.1)
Sodium: 141 mEq/L (ref 135–145)
Total Bilirubin: 0.5 mg/dL (ref 0.2–1.2)
Total Protein: 7.4 g/dL (ref 6.0–8.3)

## 2018-02-04 LAB — GAMMA GT: GGT: 27 U/L (ref 7–51)

## 2018-02-04 NOTE — Progress Notes (Signed)
CBC

## 2018-02-24 ENCOUNTER — Telehealth: Payer: 59 | Admitting: Physician Assistant

## 2018-02-24 DIAGNOSIS — J208 Acute bronchitis due to other specified organisms: Secondary | ICD-10-CM

## 2018-02-24 MED ORDER — BENZONATATE 200 MG PO CAPS: 200.0000 mg | ORAL_CAPSULE | Freq: Two times a day (BID) | ORAL | 0 refills | Status: DC | PRN

## 2018-02-24 MED ORDER — PREDNISONE 20 MG PO TABS: 20.0000 mg | ORAL_TABLET | Freq: Every day | ORAL | 0 refills | Status: DC

## 2018-02-24 NOTE — Progress Notes (Signed)
We are sorry that you are not feeling well.  Here is how we plan to help!  Based on your presentation I believe you most likely have A cough due to a virus.  This is called viral bronchitis and is best treated by rest, plenty of fluids and control of the cough.  You may use Ibuprofen or Tylenol as directed to help your symptoms.     In addition you may use A prescription cough medication called Tessalon Perles 100mg . You may take 1-2 capsules every 8 hours as needed for your cough. To help with inflammation in your lungs I am prescribing a prednisone burst.  Please take this as prescribed.    From your responses in the eVisit questionnaire you describe inflammation in the upper respiratory tract which is causing a significant cough.  This is commonly called Bronchitis and has four common causes:   Allergies Viral Infections Acid Reflux Bacterial Infection Allergies, viruses and acid reflux are treated by controlling symptoms or eliminating the cause. An example might be a cough caused by taking certain blood pressure medications. You stop the cough by changing the medication. Another example might be a cough caused by acid reflux. Controlling the reflux helps control the cough.  USE OF BRONCHODILATOR ("RESCUE") INHALERS: There is a risk from using your bronchodilator too frequently.  The risk is that over-reliance on a medication which only relaxes the muscles surrounding the breathing tubes can reduce the effectiveness of medications prescribed to reduce swelling and congestion of the tubes themselves.  Although you feel brief relief from the bronchodilator inhaler, your asthma may actually be worsening with the tubes becoming more swollen and filled with mucus.  This can delay other crucial treatments, such as oral steroid medications. If you need to use a bronchodilator inhaler daily, several times per day, you should discuss this with your provider.  There are probably better treatments that could  be used to keep your asthma under control.     HOME CARE Only take medications as instructed by your medical team. Complete the entire course of an antibiotic. Drink plenty of fluids and get plenty of rest. Avoid close contacts especially the very young and the elderly Cover your mouth if you cough or cough into your sleeve. Always remember to wash your hands A steam or ultrasonic humidifier can help congestion.   GET HELP RIGHT AWAY IF: You develop worsening fever. You become short of breath You cough up blood. Your symptoms persist after you have completed your treatment plan MAKE SURE YOU  Understand these instructions. Will watch your condition. Will get help right away if you are not doing well or get worse.  Your e-visit answers were reviewed by a board certified advanced clinical practitioner to complete your personal care plan.  Depending on the condition, your plan could have included both over the counter or prescription medications. If there is a problem please reply once you have received a response from your provider. Your safety is important to [place].  If you have drug allergies check your prescription carefully.    You can use MyChart to ask questions about today's visit, request a non-urgent call back, or ask for a work or school excuse for 24 hours related to this e-Visit. If it has been greater than 24 hours you will need to follow up with your provider, or enter a new e-Visit to address those concerns. You will get an e-mail in the next two days asking about your experience.  I hope that your e-visit has been valuable and will speed your recovery. Thank you for using e-visits.   ===View-only below this line===   ----- Message -----    From: Robert Bellow, Dr.    Durene Cal: 02/24/2018  9:51 AM EST      To: E-Visit Mailing List Subject: E-Visit Submission: Cough  E-Visit Submission: Cough --------------------------------  Question: How long have you been  coughing? Answer:   7 or more days  Question: How would you describe the cough? Answer:   A deep cough  Question: How often are you coughing? Answer:   In spasms that come and go  Question: Does the cough prevent you from sleeping at night? Answer:   Yes  Question: What other symptoms have you experienced with the cough? Answer:   Runny nose  Question: Do you have a fever? Answer:   No, I do not have a fever  Question: Are you coughing up any mucus? Answer:   I am coughing up a little bit of mucus  Question: Do you use a maintenance inhaler? Answer:   No  Question: Do you use a rescue inhaler (such as Ventolin?) Answer:   No  Question: Have you previously required a prescription for prednisone for cough? Answer:   No  Question: Are you diabetic? Answer:   No  Question: What is the appearance of the mucus? Answer:   The mucus is thick            The mucus has changed from thin to thick            The mucus has changed from clear to colored  Question: Do you have any of the following? Answer:   None of the above  Question: Do you smoke? Answer:   No  Question: Have you ever smoked? Answer:   I have never smoked  Question: Are there people you know with similar symptoms? Answer:   No  Question: Are you experiencing any of the following? Answer:   None of  the above  Question: Are you having difficulty breathing? Answer:   No  Question: Is your coughing worse when you are exposed to pollen, dust, or other things in the environment? Answer:   No  Question: Have you been treated for a similar cough in the past? Answer:   No  Question: Have you ever been diagnosed with asthma, bronchitis, or lung disease? Answer:   No  Question: Have you recently started on any medications for your heart or for blood pressure? Answer:   No  Question: Have you recently been hospitalized? Answer:   No  Question: Please list your medication allergies that you may have ?  (If 'none' , please list as 'none') Answer:   Demerol  Question: Please list any additional comments  Answer:   Cough started 2 weeks ago, associated with rhinorrhea at onset. Sputum initially clear, thin. Over 3-4 days became thick, white. Over the next 3-4 days change to "sea foam green" color.  Returned to clear, thick sputum 4 days ago, now back to green. Rhinorrhea has resolved.  Treated with Robitussin DM and HS Tussinex.  PRN Tessalon pearls.

## 2018-02-25 ENCOUNTER — Other Ambulatory Visit: Payer: Self-pay | Admitting: Internal Medicine

## 2018-02-25 DIAGNOSIS — J0110 Acute frontal sinusitis, unspecified: Secondary | ICD-10-CM | POA: Insufficient documentation

## 2018-02-25 MED ORDER — AMOXICILLIN-POT CLAVULANATE 875-125 MG PO TABS: 1.0000 | ORAL_TABLET | Freq: Two times a day (BID) | ORAL | 0 refills | Status: DC

## 2018-02-25 MED ORDER — PREDNISONE 10 MG PO TABS: ORAL_TABLET | ORAL | 0 refills | Status: DC

## 2018-02-25 NOTE — Assessment & Plan Note (Signed)
Symptoms have failed to resolved after 14 days of supportive care.  Augmentin, prednisone taper,  probioic advised x 3 weeks

## 2018-03-12 ENCOUNTER — Ambulatory Visit: Payer: 59 | Admitting: Family Medicine

## 2018-03-12 ENCOUNTER — Encounter: Payer: Self-pay | Admitting: Family Medicine

## 2018-03-12 DIAGNOSIS — R05 Cough: Secondary | ICD-10-CM | POA: Diagnosis not present

## 2018-03-12 DIAGNOSIS — F4320 Adjustment disorder, unspecified: Secondary | ICD-10-CM | POA: Diagnosis not present

## 2018-03-12 DIAGNOSIS — R059 Cough, unspecified: Secondary | ICD-10-CM

## 2018-03-12 NOTE — Progress Notes (Signed)
Started end of 01/2018.  Clear sputum, rhinorrhea.  Then inc in sputum.  More cough.  02/2018 with discolored sputum. Taking cough meds.  Started pred and augmentin 02/25/2018.  Done with both.  Cough and sputum improved some then 03/07/2018 restarted betamethasone inhaler.    No fevers.  No vomiting, no diarrhea.  He doesn't feel unwell but is still coughing.  Not SOB.  No chest discomfort. Cough is better than a few weeks ago.  Rhinorrhea is resolved. Tessalon helps some.    He didn't have benefit from metoprolol.  He isn't having CP or tachycardia.  Has been able to exert w/o troubles.    Meds, vitals, and allergies reviewed.   ROS: Per HPI unless specifically indicated in ROS section   GEN: nad, alert and oriented HEENT: mucous membranes moist, tm w/o erythema, nasal exam w/o erythema, mild clear discharge noted,  OP without cobblestoning, no exudates NECK: supple w/o LA CV: rrr.   PULM: ctab, no inc wob, no wheeze EXT: no edema SKIN: Well-perfused

## 2018-03-12 NOTE — Patient Instructions (Signed)
Use tessalon.  Update me as needed.  Should gradually get better.  Update me as needed.  Reasonable to continue inhaler for now and rinse after use.  Stop when better.  Take care.  Glad to see you.

## 2018-03-15 NOTE — Assessment & Plan Note (Addendum)
Benign exam.  Likely postinfectious cough.  Discussed with patient Use tessalon.  Update me as needed.  Should gradually get better.  Reasonable to continue steroid inhaler for now (in case it is doing any good as the risk of use is low) and rinse after use.  Stop when better.  He agrees with plan.

## 2018-03-18 DIAGNOSIS — Z961 Presence of intraocular lens: Secondary | ICD-10-CM | POA: Diagnosis not present

## 2018-03-18 DIAGNOSIS — H524 Presbyopia: Secondary | ICD-10-CM | POA: Diagnosis not present

## 2018-03-23 DIAGNOSIS — F4320 Adjustment disorder, unspecified: Secondary | ICD-10-CM | POA: Diagnosis not present

## 2018-04-13 DIAGNOSIS — F4320 Adjustment disorder, unspecified: Secondary | ICD-10-CM | POA: Diagnosis not present

## 2018-04-14 ENCOUNTER — Encounter: Payer: Self-pay | Admitting: Family Medicine

## 2018-04-22 ENCOUNTER — Other Ambulatory Visit: Payer: Self-pay | Admitting: Family Medicine

## 2018-04-22 MED ORDER — ESCITALOPRAM OXALATE 5 MG PO TABS: 5.0000 mg | ORAL_TABLET | Freq: Every day | ORAL | 3 refills | Status: DC

## 2018-06-04 ENCOUNTER — Ambulatory Visit (INDEPENDENT_AMBULATORY_CARE_PROVIDER_SITE_OTHER): Payer: 59 | Admitting: Family Medicine

## 2018-06-04 DIAGNOSIS — Z7689 Persons encountering health services in other specified circumstances: Secondary | ICD-10-CM | POA: Diagnosis not present

## 2018-06-04 DIAGNOSIS — G479 Sleep disorder, unspecified: Secondary | ICD-10-CM | POA: Diagnosis not present

## 2018-06-04 MED FILL — VALSARTAN-HCTZ 160-25 MG TA: 160-25 | 30 days supply | Qty: 30 | Fill #0 | Status: TO

## 2018-06-04 NOTE — Progress Notes (Signed)
Virtual visit completed through WebEx or similar program Patient location: home  Provider location: [place] at [facility], office   Limitations and rationale for visit method d/w patient.  Patient agreed to proceed.   CC: mood f/u.    HPI:  Discussed recent pandemic considerations.    History of sleep interruption related to stressors.  His mood is good.  He is happy to be back at work.  No ADE on med.  Sleep is okay, improved on lexapro. He doesn't have waking until about 3:30AM, prev was 2AM.  He gets back to sleep better.  He isn't dwelling on prev events.    We talked about typical duration of tx, at least 6 months.    Meds and allergies reviewed.   ROS: Per HPI unless specifically indicated in ROS section   NAD Speech wnl Affect appropriate.  A/P:  Sleep change/concern.  Improved in the meantime.  Continue med for now, with Lexapro as is.  Would likely continue for 6 months and then consider taper at that point.  He agrees.  Okay for outpatient follow-up.

## 2018-06-07 DIAGNOSIS — Z7689 Persons encountering health services in other specified circumstances: Secondary | ICD-10-CM | POA: Insufficient documentation

## 2018-06-07 NOTE — Assessment & Plan Note (Signed)
Improved in the meantime.  Continue med for now, with Lexapro as is.  Would likely continue for 6 months and then consider taper at that point.  He agrees.  Okay for outpatient follow-up.

## 2018-06-08 MED FILL — LANSOPRAZOLE 15 MG CPDR: 15 | 90 days supply | Qty: 90 | Fill #0 | Status: TO

## 2018-09-15 ENCOUNTER — Encounter: Payer: Self-pay | Admitting: Family Medicine

## 2018-10-12 ENCOUNTER — Other Ambulatory Visit: Payer: Self-pay | Admitting: *Deleted

## 2018-10-12 ENCOUNTER — Encounter: Payer: Self-pay | Admitting: Family Medicine

## 2018-10-12 MED ORDER — ROSUVASTATIN CALCIUM 5 MG PO TABS: 5.0000 mg | ORAL_TABLET | Freq: Every day | ORAL | 3 refills | Status: DC

## 2018-10-19 ENCOUNTER — Ambulatory Visit (INDEPENDENT_AMBULATORY_CARE_PROVIDER_SITE_OTHER): Payer: Medicare Other | Admitting: Family Medicine

## 2018-10-19 ENCOUNTER — Encounter: Payer: Self-pay | Admitting: Family Medicine

## 2018-10-19 VITALS — BP 158/96 | HR 76 | Temp 97.6°F | Ht 70.0 in | Wt 210.0 lb

## 2018-10-19 DIAGNOSIS — R059 Cough, unspecified: Secondary | ICD-10-CM

## 2018-10-19 DIAGNOSIS — K219 Gastro-esophageal reflux disease without esophagitis: Secondary | ICD-10-CM

## 2018-10-19 DIAGNOSIS — R05 Cough: Secondary | ICD-10-CM | POA: Diagnosis not present

## 2018-10-19 NOTE — Progress Notes (Signed)
Jonathan Virgen T. 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 Cough  . Gastroesophageal Reflux   Virtual Visit via Video Note:  I connected with  Jonathan Dixon, Dr. on 10/19/2018  3:40 PM EDT by a video enabled telemedicine application and verified that I am speaking with the correct person using two identifiers.   Location patient: home computer, tablet, or smartphone Location provider: work or home office Consent: Verbal consent directly obtained from Jonathan Dixon, Dr.. Persons participating in the virtual visit: patient, provider  I discussed the limitations of evaluation and management by telemedicine and the availability of in person appointments. The patient expressed understanding and agreed to proceed.  History of Present Illness:  Presents with a question of cough.  GERD vs AR  He has a history of having some cough in the spring through the fall.  He also has some reflux, but he thinks that his reflux is not really been helped quite as much with Prevacid.  He has been coughing a lot even though he is been taking exam Tessalon Perles as well as some Robitussin.  He also took a dose of Tussionex, did not really help much.  He does not have a history of asthma or COPD.  He does not think that he has significant allergic rhinitis but he did have some allergies when he was a child had an allergy shots  Review of Systems as above: See pertinent positives and pertinent negatives per HPI No acute distress verbally  Past Medical History, Surgical History, Social History, Family History, Problem List, Medications, and Allergies have been  reviewed and updated if relevant.   Observations/Objective/Exam:  An attempt was made to discern vital signs over the phone and per patient if applicable and possible.   General:    Alert, Oriented, appears well and in no acute distress HEENT:     Atraumatic, conjunctiva clear, no obvious abnormalities on inspection of external nose and ears.  Neck:    Normal movements of the head and neck Pulmonary:     On inspection no signs of respiratory distress, breathing rate appears normal, no obvious gross SOB, gasping or wheezing Cardiovascular:    No obvious cyanosis Musculoskeletal:    Moves all visible extremities without noticeable abnormality Psych / Neurological:     Pleasant and cooperative, no obvious depression or anxiety, speech and thought processing grossly intact  Assessment and Plan:    ICD-10-CM   1. Gastroesophageal reflux disease, esophagitis presence not specified  K21.9   2. Cough  R05    Cough unclear origin, increase Prevacid dose to twice daily.  Add Flonase as well as Zyrtec.  I discussed the assessment and treatment plan with the patient. The patient was provided an opportunity to ask questions and all were answered. The patient agreed with the plan and demonstrated an understanding of the instructions.   The patient was advised to call back or seek an in-person evaluation if the symptoms worsen or if the condition fails to improve as anticipated.  Follow-up: prn unless noted otherwise below No follow-ups on file.  No orders of the defined types were placed  in this encounter.  No orders of the defined types were placed in this encounter.   Signed,  Maud Deed. Kyndall Chaplin, MD

## 2018-11-17 ENCOUNTER — Ambulatory Visit (INDEPENDENT_AMBULATORY_CARE_PROVIDER_SITE_OTHER)
Admission: RE | Admit: 2018-11-17 | Discharge: 2018-11-17 | Disposition: A | Discharge status: Home / Self Care | Payer: Medicare Other | Source: Ambulatory Visit | Attending: Family Medicine | Admitting: Family Medicine

## 2018-11-17 ENCOUNTER — Ambulatory Visit (INDEPENDENT_AMBULATORY_CARE_PROVIDER_SITE_OTHER): Payer: Medicare Other | Admitting: Family Medicine

## 2018-11-17 ENCOUNTER — Encounter: Payer: Self-pay | Admitting: Family Medicine

## 2018-11-17 ENCOUNTER — Other Ambulatory Visit: Payer: Self-pay

## 2018-11-17 VITALS — BP 142/84 | HR 73 | Temp 97.7°F | Ht 70.0 in | Wt 215.6 lb

## 2018-11-17 DIAGNOSIS — R05 Cough: Secondary | ICD-10-CM

## 2018-11-17 DIAGNOSIS — R059 Cough, unspecified: Secondary | ICD-10-CM

## 2018-11-17 DIAGNOSIS — Z23 Encounter for immunization: Secondary | ICD-10-CM

## 2018-11-17 MED ORDER — ALBUTEROL SULFATE HFA 108 (90 BASE) MCG/ACT IN AERS: 1.0000 | INHALATION_SPRAY | Freq: Four times a day (QID) | RESPIRATORY_TRACT | 1 refills | Status: DC | PRN

## 2018-11-17 MED ORDER — BENZONATATE 200 MG PO CAPS: 200.0000 mg | ORAL_CAPSULE | Freq: Three times a day (TID) | ORAL | 1 refills | Status: DC | PRN

## 2018-11-17 NOTE — Patient Instructions (Signed)
Flu shot today.  Go to the lab on the way out.  We'll contact you with your xray report. Try albuterol as needed and update me.  Take care.  Glad to see you.

## 2018-11-17 NOTE — Progress Notes (Signed)
Cough.  He would prev had a wintertime cough that got better with robitussin.  This spring it didn't get better, worse in the last month.  E visit 10/19/2018, added oral claritin, flonase w/o much relief.  Not much upper airway sx at this point, no rhinorrhea.  No fevers.  Now some better in the last 4-5 days.  Minimal sputum, white.  No fevers.  No vomiting, no diarrhea.   Less nighttime cough now.  Out of tessalon in the meantime.  On PPI at baseline.   Not SOB.  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    Meds, vitals, and allergies reviewed.   ROS: Per HPI unless specifically indicated in ROS section   GEN: nad, alert and oriented HEENT: mucous membranes moist NECK: supple w/o LA CV: rrr.  PULM: ctab, no inc wob, no wheeze.  Occasional mild cough noted. ABD: soft, +bs EXT: no edema SKIN: Well-perfused

## 2018-11-18 ENCOUNTER — Encounter: Payer: Self-pay | Admitting: Family Medicine

## 2018-11-18 MED ORDER — VALSARTAN-HYDROCHLOROTHIAZIDE 160-25 MG PO TABS: 1.0000 | ORAL_TABLET | Freq: Every day | ORAL | 0 refills | Status: DC

## 2018-11-18 NOTE — Assessment & Plan Note (Signed)
Ddx d/w pt, including the following possibilities. GERD Asthma/RAD COPD Allergies/postnasal drip. Not on ACE.  Is on ARB w/o troubles.   Reasonable to try albuterol as needed in the meantime.  Can use Tessalon as needed.  Check chest x-ray.  He will update me as needed.  Okay for outpatient follow-up.

## 2019-02-10 ENCOUNTER — Other Ambulatory Visit: Payer: Self-pay | Admitting: Family Medicine

## 2019-02-10 ENCOUNTER — Encounter: Payer: Self-pay | Admitting: Family Medicine

## 2019-02-10 MED ORDER — VALSARTAN-HYDROCHLOROTHIAZIDE 160-25 MG PO TABS: 1.0000 | ORAL_TABLET | Freq: Every day | ORAL | 1 refills | Status: DC

## 2019-02-10 NOTE — Telephone Encounter (Signed)
Dr Damita Dunnings when do you want to see patient back. His last physical was in 2019. Thank you

## 2019-03-15 ENCOUNTER — Ambulatory Visit (INDEPENDENT_AMBULATORY_CARE_PROVIDER_SITE_OTHER): Payer: Medicare Other | Admitting: *Deleted

## 2019-03-15 ENCOUNTER — Other Ambulatory Visit: Payer: Self-pay

## 2019-03-15 DIAGNOSIS — Z111 Encounter for screening for respiratory tuberculosis: Secondary | ICD-10-CM

## 2019-05-09 ENCOUNTER — Encounter: Payer: Self-pay | Admitting: Family Medicine

## 2019-05-12 ENCOUNTER — Encounter: Payer: Self-pay | Admitting: Family Medicine

## 2019-05-31 ENCOUNTER — Encounter: Payer: Self-pay | Admitting: Family Medicine

## 2019-06-01 ENCOUNTER — Other Ambulatory Visit
Admission: RE | Admit: 2019-06-01 | Discharge: 2019-06-01 | Disposition: A | Discharge status: Home / Self Care | Payer: Medicare Other | Source: Ambulatory Visit | Attending: Family Medicine | Admitting: Family Medicine

## 2019-06-01 ENCOUNTER — Other Ambulatory Visit: Payer: Self-pay | Admitting: Family Medicine

## 2019-06-01 DIAGNOSIS — Z20822 Contact with and (suspected) exposure to covid-19: Secondary | ICD-10-CM | POA: Insufficient documentation

## 2019-06-01 DIAGNOSIS — Z1152 Encounter for screening for COVID-19: Secondary | ICD-10-CM

## 2019-06-01 DIAGNOSIS — Z01812 Encounter for preprocedural laboratory examination: Secondary | ICD-10-CM | POA: Insufficient documentation

## 2019-06-01 LAB — SARS CORONAVIRUS 2 (TAT 6-24 HRS): SARS Coronavirus 2: NEGATIVE

## 2019-06-01 NOTE — Telephone Encounter (Signed)
See my chart message.  I did not think patient needed an order for Covid testing.  I sent a note back to the patient in the meantime.  Please see what details you can get on this and let me know.  Thanks.

## 2019-06-29 ENCOUNTER — Ambulatory Visit: Payer: Medicare Other | Admitting: Family Medicine

## 2019-06-29 ENCOUNTER — Encounter: Payer: Self-pay | Admitting: Family Medicine

## 2019-06-29 LAB — LAB REPORT - SCANNED
A1c: 6
ALT: 22 (ref 3–30)
AST: 26
Cholesterol: 157
Creatinine, Ser: 0.91
Glucose: 102
HDL: 37
Hemoglobin: 13.2
LDL (calc): 86
Potassium: 4.3
Prostate Specific Ag, Serum: 0.9
TSH: 2.81
Triglycerides: 202 — AB (ref 40–160)

## 2019-07-01 ENCOUNTER — Ambulatory Visit: Payer: Medicare Other | Admitting: Family Medicine

## 2019-07-08 ENCOUNTER — Ambulatory Visit: Payer: Medicare Other | Admitting: Family Medicine

## 2019-08-03 ENCOUNTER — Other Ambulatory Visit: Payer: Self-pay

## 2019-08-03 ENCOUNTER — Ambulatory Visit (INDEPENDENT_AMBULATORY_CARE_PROVIDER_SITE_OTHER): Payer: Medicare Other | Admitting: Family Medicine

## 2019-08-03 ENCOUNTER — Encounter: Payer: Self-pay | Admitting: Family Medicine

## 2019-08-03 VITALS — BP 162/78 | HR 48 | Temp 97.5°F | Ht 69.5 in | Wt 219.0 lb

## 2019-08-03 DIAGNOSIS — N529 Male erectile dysfunction, unspecified: Secondary | ICD-10-CM

## 2019-08-03 DIAGNOSIS — I10 Essential (primary) hypertension: Secondary | ICD-10-CM | POA: Diagnosis not present

## 2019-08-03 DIAGNOSIS — E781 Pure hyperglyceridemia: Secondary | ICD-10-CM

## 2019-08-03 DIAGNOSIS — Z23 Encounter for immunization: Secondary | ICD-10-CM

## 2019-08-03 MED ORDER — SILDENAFIL CITRATE 20 MG PO TABS: 20.0000 mg | ORAL_TABLET | Freq: Every day | ORAL | 12 refills | Status: DC | PRN

## 2019-08-03 MED ORDER — VALSARTAN-HYDROCHLOROTHIAZIDE 160-25 MG PO TABS: 1.0000 | ORAL_TABLET | Freq: Every day | ORAL | 3 refills | Status: DC

## 2019-08-03 MED ORDER — ROSUVASTATIN CALCIUM 5 MG PO TABS: 5.0000 mg | ORAL_TABLET | Freq: Every day | ORAL | 3 refills | Status: DC

## 2019-08-03 MED ORDER — LANSOPRAZOLE 15 MG PO CPDR: 15.0000 mg | DELAYED_RELEASE_CAPSULE | Freq: Every day | ORAL | 3 refills | Status: DC

## 2019-08-03 NOTE — Patient Instructions (Addendum)
Pneumonia shot today.  Call about a colonoscopy when possible.  Take care.  Glad to see you.  Cut back on salt and let me know about your BP after your work situation changes.

## 2019-08-03 NOTE — Progress Notes (Addendum)
This visit occurred during the SARS-CoV-2 public health emergency.  Safety protocols were in place, including screening questions prior to the visit, additional usage of staff PPE, and extensive cleaning of exam room while observing appropriate contact time as indicated for disinfecting solutions.  He is going to start working through [employer] soon.  D/w pt.    Tetanus 2015 Flu shot done at work Shingles 2015, d/w pt.  PNA 2021 covid vaccine pfizer 2021.   HIV and HCV prev neg.  Living will d/w pt. Wife designated if patient were incapacitated.  Diet and exercise d/w pt.  Colonoscopy 2015. He'll call about follow up.  I'll defer to patient.  PSA wnl 2021  Prev labs d/w pt.   ED.  Discussed options.  No NTG use.  Routine cautions d/w pt regarding sildenafil use.  Elevated Cholesterol: Using medications without problems: yes, still on crestor Muscle aches: some occ cramps at night, likely not from statin.   Diet compliance: encouraged.   Exercise: encouraged.    Hypertension:    Using medication without problems or lightheadedness: yes Chest pain with exertion: no Edema: no Short of breath:no Average home BPs: occ  He had prev cardiology eval.  No syncope.   He has h/o lower pulse at baseline.    We talked about prev chest imaging w/o apparent need to repeat imaging now.  He had discussed with cardiothoracic colleagues.  He agreed.   PMH and SH reviewed Meds, vitals, and allergies reviewed.   ROS: Per HPI.  Unless specifically indicated otherwise in HPI, the patient denies:  General: fever. Eyes: acute vision changes ENT: sore throat Cardiovascular: chest pain Respiratory: SOB GI: vomiting GU: dysuria Musculoskeletal: acute back pain Derm: acute rash Neuro: acute motor dysfunction Psych: worsening mood Endocrine: polydipsia Heme: bleeding Allergy: hayfever  GEN: nad, alert and oriented HEENT: ncat NECK: supple w/o LA CV: rrr. PULM: ctab,  no inc wob ABD: soft, +bs EXT: no edema SKIN: well perfused.

## 2019-08-04 DIAGNOSIS — N529 Male erectile dysfunction, unspecified: Secondary | ICD-10-CM | POA: Insufficient documentation

## 2019-08-04 NOTE — Assessment & Plan Note (Signed)
Continue Crestor for now.  Continue work on diet and exercise.  He agrees.

## 2019-08-04 NOTE — Assessment & Plan Note (Signed)
I do not want to induce hypotension.  He is going to cut back on salt and see how his blood pressure does with his upcoming/pending work change.  Still okay for outpatient follow-up.  He agrees with plan.  Continue valsartan hydrochlorothiazide for now.

## 2019-08-04 NOTE — Assessment & Plan Note (Signed)
Routine cautions given regarding sildenafil.  He can use that as needed and then update me as needed.  He agrees.  No nitroglycerin use.

## 2019-10-29 ENCOUNTER — Other Ambulatory Visit: Payer: Self-pay | Admitting: Family Medicine

## 2019-10-29 ENCOUNTER — Encounter: Payer: Self-pay | Admitting: Family Medicine

## 2019-10-29 DIAGNOSIS — I10 Essential (primary) hypertension: Secondary | ICD-10-CM

## 2019-10-29 MED ORDER — VALSARTAN 80 MG PO TABS: 80.0000 mg | ORAL_TABLET | Freq: Every day | ORAL | 1 refills | Status: DC

## 2019-11-25 ENCOUNTER — Encounter: Payer: Self-pay | Admitting: Family Medicine

## 2019-11-25 ENCOUNTER — Other Ambulatory Visit: Payer: Self-pay | Admitting: General Surgery

## 2019-11-25 DIAGNOSIS — C4359 Malignant melanoma of other part of trunk: Secondary | ICD-10-CM

## 2019-11-29 MED ORDER — AMLODIPINE BESYLATE 2.5 MG PO TABS: 2.5000 mg | ORAL_TABLET | Freq: Every day | ORAL | 1 refills | Status: DC

## 2019-12-14 ENCOUNTER — Ambulatory Visit: Payer: Self-pay | Admitting: General Surgery

## 2019-12-14 NOTE — H&P (Signed)
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  Dermatology evaluated the patient and shave biopsy was done.  Shave biopsy shows melanoma of the skin with depth of at least 0.6 mm.  Deep margin was positive.  Punch biopsy of the same area did not shows any residual adenoma.  Excisional biopsy showed 0.4 mm with negative depth margin.  The new deep margin is negative.    Past Medical History Past Medical History:  Diagnosis Date  . Cancer (HCC)    dysplastic nevus on face  . Detached retina   . GERD (gastroesophageal reflux disease)   . History of ETT 1998   With Tom Wall, repeat testing with Dr. Fath also neg  . History of staph infection   . Hyperlipidemia   . Hypertension   . Iritis 1983     Prednisone, ASA, NSAIDS, phenylbutisone flare as chief resident      Past Surgical History:  Procedure Laterality Date  . APPENDECTOMY  14 yoa  . Bilateral knee contractures  as a child  . CATARACT EXTRACTION W/ INTRAOCULAR LENS  IMPLANT, BILATERAL     2013  . CORONARY ANGIOGRAM     2005, EF 72%  . ESOPHAGOGASTRODUODENOSCOPY  11/24/98   esophgitis with stricture, HH, gastritis  . ESOPHAGOGASTRODUODENOSCOPY  06/14/2009   poss Barrett's, HH, gastritis (Dr. Elliott)  . EYE SURGERY     ret detach 2005  . JRA  17 yoa   B27+  . LUMBAR LAMINECTOMY/DECOMPRESSION MICRODISCECTOMY Left 07/10/2012   Procedure: Left lumbar four-five microdiskectomy;  Surgeon: Henry A Pool, MD;  Location: [facility];  Service: Neurosurgery;  Laterality: Left;  Left lumbar four-five microdiskectomy    Allergies  Allergen Reactions  . Inderal [Propranolol] Other (See Comments)    fatigue  . Meperidine Hcl Other (See Comments)    REACTION: Dysphoria, hypotension    Current Outpatient Medications  Medication Sig Dispense Refill  . acetaminophen (TYLENOL) 500 MG tablet Take 500 mg by mouth every 6 (six) hours as needed for moderate pain.     . albuterol (VENTOLIN  HFA) 108 (90 Base) MCG/ACT inhaler Inhale 1-2 puffs into the lungs every 6 (six) hours as needed. 18 g 1  . amLODipine (NORVASC) 2.5 MG tablet Take 1-2 tablets (2.5-5 mg total) by mouth daily. 180 tablet 1  . benzonatate (TESSALON) 200 MG capsule Take 1 capsule (200 mg total) by mouth 3 (three) times daily as needed. 30 capsule 1  . lansoprazole (PREVACID) 15 MG capsule Take 1 capsule (15 mg total) by mouth daily. Take 15 mg daily (Patient taking differently: Take 15 mg by mouth daily. ) 90 capsule 3  . multivitamin-lutein (OCUVITE-LUTEIN) CAPS capsule Take 1 capsule by mouth daily.    . rosuvastatin (CRESTOR) 5 MG tablet Take 1 tablet (5 mg total) by mouth daily. 90 tablet 3  . sildenafil (REVATIO) 20 MG tablet Take 1-5 tablets (20-100 mg total) by mouth daily as needed. 50 tablet 12  . valsartan (DIOVAN) 80 MG tablet Take 1-2 tablets (80-160 mg total) by mouth daily. Take in addition to valsartan HCTZ (Patient not taking: Reported on 12/07/2019) 60 tablet 1  . valsartan-hydrochlorothiazide (DIOVAN-HCT) 160-25 MG tablet Take 1 tablet by mouth daily. 90 tablet 3   No current facility-administered medications for this visit.    Family History Family History  Problem Relation Age of Onset  . Hypertension Mother   . Heart disease Mother          by pass graft, on Lipitor  . Stroke Mother        from atrial fibrilation  . Hypertension Father   . Heart disease Brother        mild cardiac disease, intermmittent SVT  . Diabetes Maternal Grandmother   . Colon cancer Neg Hx   . Prostate cancer Neg Hx       Social History Social History   Tobacco Use  . Smoking status: Never Smoker  . Smokeless tobacco: Never Used  Substance Use Topics  . Alcohol use: Yes    Alcohol/week: 0.0 standard drinks    Comment: 1 a day  . Drug use: No      ROS Full ROS of systems performed and is otherwise negative there than what is stated in the HPI  Physical Exam There were no vitals taken for this  visit.  CONSTITUTIONAL: Alert and oriented EYES: Pupils equal, round, and reactive to light, Sclera non-icteric. EARS, NOSE, MOUTH AND THROAT: The oropharynx is clear. Oral mucosa is pink and moist. Hearing is intact to voice.  NECK: Trachea is midline, and there is no jugular venous distension. Thyroid is without palpable abnormalities. LYMPH NODES:  Lymph nodes in the neck are not enlarged. RESPIRATORY:  Lungs are clear, and breath sounds are equal bilaterally. Normal respiratory effort without pathologic use of accessory muscles. CARDIOVASCULAR: Heart is regular without murmurs, gallops, or rubs. GI: The abdomen is soft, nontender, and nondistended. There were no palpable masses. There was no hepatosplenomegaly. There were normal bowel sounds. GU: Deferred MUSCULOSKELETAL:  Normal muscle strength and tone in all four extremities.    SKIN: Skin turgor is normal. There are no pathologic skin lesions.  Scar on back well-healed NEUROLOGIC:  Motor and sensation is grossly normal.  Cranial nerves are grossly intact. PSYCH:  Alert and oriented to person, place and time. Affect is normal.  I have personally reviewed the patient's imaging and medical records.    Assessment and plan: Patient with at T1a melanoma of the back.  Final pathology shows 0 per square mitoses, no ulcerations, no satellitosis, no vascular invasion.  There is partial regression.  Due to the depth of 1.0 mm it is recommended to proceed with sentinel node biopsy.  Patient will have scintigraphy for evaluation of which nodal basins will need to be evaluated with sentinel node biopsy.  With the scintigraphy mapping we will proceed with sentinel biopsy.    Ladarius Seubert Cintron-Diaz, MD  Keishaun Hazel Cintron-Diaz 12/14/2019, 8:35 AM  

## 2019-12-14 NOTE — H&P (View-Only) (Signed)
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  Dermatology evaluated the patient and shave biopsy was done.  Shave biopsy shows melanoma of the skin with depth of at least 0.6 mm.  Deep margin was positive.  Punch biopsy of the same area did not shows any residual adenoma.  Excisional biopsy showed 0.4 mm with negative depth margin.  The new deep margin is negative.    Past Medical History Past Medical History:  Diagnosis Date  . Cancer ([place])    dysplastic nevus on face  . Detached retina   . GERD (gastroesophageal reflux disease)   . History of ETT [place], repeat testing with Dr. Ubaldo Glassing also neg  . History of staph infection   . Hyperlipidemia   . Hypertension   . Iritis 1983     Prednisone, ASA, NSAIDS, phenylbutisone flare as chief resident      Past Surgical History:  Procedure Laterality Date  . APPENDECTOMY  14 yoa  . Bilateral knee contractures  as a child  . CATARACT EXTRACTION W/ INTRAOCULAR LENS  IMPLANT, BILATERAL     2013  . CORONARY ANGIOGRAM     2005, EF 72%  . ESOPHAGOGASTRODUODENOSCOPY  11/24/98   esophgitis with stricture, HH, gastritis  . ESOPHAGOGASTRODUODENOSCOPY  06/14/2009   poss Barrett's, HH, gastritis (Dr. Vira Agar)  . EYE SURGERY     ret detach 2005  . JRA  17 yoa   B27+  . LUMBAR LAMINECTOMY/DECOMPRESSION MICRODISCECTOMY Left 07/10/2012   Procedure: Left lumbar four-five microdiskectomy;  Surgeon: Charlie Pitter, MD;  Location: [facility];  Service: Neurosurgery;  Laterality: Left;  Left lumbar four-five microdiskectomy    Allergies  Allergen Reactions  . Inderal [Propranolol] Other (See Comments)    fatigue  . Meperidine Hcl Other (See Comments)    REACTION: Dysphoria, hypotension    Current Outpatient Medications  Medication Sig Dispense Refill  . acetaminophen (TYLENOL) 500 MG tablet Take 500 mg by mouth every 6 (six) hours as needed for moderate pain.     Marland Kitchen albuterol (VENTOLIN  HFA) 108 (90 Base) MCG/ACT inhaler Inhale 1-2 puffs into the lungs every 6 (six) hours as needed. 18 g 1  . amLODipine (NORVASC) 2.5 MG tablet Take 1-2 tablets (2.5-5 mg total) by mouth daily. 180 tablet 1  . benzonatate (TESSALON) 200 MG capsule Take 1 capsule (200 mg total) by mouth 3 (three) times daily as needed. 30 capsule 1  . lansoprazole (PREVACID) 15 MG capsule Take 1 capsule (15 mg total) by mouth daily. Take 15 mg daily (Patient taking differently: Take 15 mg by mouth daily. ) 90 capsule 3  . multivitamin-lutein (OCUVITE-LUTEIN) CAPS capsule Take 1 capsule by mouth daily.    . rosuvastatin (CRESTOR) 5 MG tablet Take 1 tablet (5 mg total) by mouth daily. 90 tablet 3  . sildenafil (REVATIO) 20 MG tablet Take 1-5 tablets (20-100 mg total) by mouth daily as needed. 50 tablet 12  . valsartan (DIOVAN) 80 MG tablet Take 1-2 tablets (80-160 mg total) by mouth daily. Take in addition to valsartan HCTZ (Patient not taking: Reported on 12/07/2019) 60 tablet 1  . valsartan-hydrochlorothiazide (DIOVAN-HCT) 160-25 MG tablet Take 1 tablet by mouth daily. 90 tablet 3   No current facility-administered medications for this visit.    Family History Family History  Problem Relation Age of Onset  . Hypertension Mother   . Heart disease Mother  by pass graft, on Lipitor  . Stroke Mother        from atrial fibrilation  . Hypertension Father   . Heart disease Brother        mild cardiac disease, intermmittent SVT  . Diabetes Maternal Grandmother   . Colon cancer Neg Hx   . Prostate cancer Neg Hx       Social History Social History   Tobacco Use  . Smoking status: Never Smoker  . Smokeless tobacco: Never Used  Substance Use Topics  . Alcohol use: Yes    Alcohol/week: 0.0 standard drinks    Comment: 1 a day  . Drug use: No      ROS Full ROS of systems performed and is otherwise negative there than what is stated in the HPI  Physical Exam There were no vitals taken for this  visit.  CONSTITUTIONAL: Alert and oriented EYES: Pupils equal, round, and reactive to light, Sclera non-icteric. EARS, NOSE, MOUTH AND THROAT: The oropharynx is clear. Oral mucosa is pink and moist. Hearing is intact to voice.  NECK: Trachea is midline, and there is no jugular venous distension. Thyroid is without palpable abnormalities. LYMPH NODES:  Lymph nodes in the neck are not enlarged. RESPIRATORY:  Lungs are clear, and breath sounds are equal bilaterally. Normal respiratory effort without pathologic use of accessory muscles. CARDIOVASCULAR: Heart is regular without murmurs, gallops, or rubs. GI: The abdomen is soft, nontender, and nondistended. There were no palpable masses. There was no hepatosplenomegaly. There were normal bowel sounds. GU: Deferred MUSCULOSKELETAL:  Normal muscle strength and tone in all four extremities.    SKIN: Skin turgor is normal. There are no pathologic skin lesions.  Scar on back well-healed NEUROLOGIC:  Motor and sensation is grossly normal.  Cranial nerves are grossly intact. PSYCH:  Alert and oriented to person, place and time. Affect is normal.  I have personally reviewed the patient's imaging and medical records.    Assessment and plan: Patient with at T1a melanoma of the back.  Final pathology shows 0 per square mitoses, no ulcerations, no satellitosis, no vascular invasion.  There is partial regression.  Due to the depth of 1.0 mm it is recommended to proceed with sentinel node biopsy.  Patient will have scintigraphy for evaluation of which nodal basins will need to be evaluated with sentinel node biopsy.  With the scintigraphy mapping we will proceed with sentinel biopsy.    Herbert Pun, MD  Herbert Pun 12/14/2019, 8:35 AM

## 2019-12-15 ENCOUNTER — Encounter
Admission: RE | Admit: 2019-12-15 | Discharge: 2019-12-15 | Disposition: A | Discharge status: Home / Self Care | Payer: Medicare Other | Source: Ambulatory Visit | Attending: General Surgery | Admitting: General Surgery

## 2019-12-15 HISTORY — DX: Other complications of anesthesia, initial encounter: T88.59XA

## 2019-12-15 NOTE — Patient Instructions (Addendum)
Your procedure is scheduled on: 12/17/19- Friday Report to Day Surgery on the 2nd floor of the [facility]. To find out your arrival time, please call [phone] between 1PM - 3PM on: 12/16/19- Thursday Covid Test, report to [facility] on 12/16/19. EKG needed , report to pre-admission testing for EKG 12/16/19. If done by outside source, please fax to [place] .  REMEMBER: Instructions that are not followed completely may result in serious medical risk, up to and including death; or upon the discretion of your surgeon and anesthesiologist your surgery may need to be rescheduled.  Do not eat food after midnight the night before surgery.  No gum chewing, lozengers or hard candies.  You may however, drink CLEAR liquids up to 2 hours before you are scheduled to arrive for your surgery. Do not drink anything within 2 hours of your scheduled arrival time.  Clear liquids include: - water  - apple juice without pulp - gatorade (not RED, PURPLE, OR BLUE) - black coffee or tea (Do NOT add milk or creamers to the coffee or tea) Do NOT drink anything that is not on this list.   TAKE THESE MEDICATIONS THE MORNING OF SURGERY WITH A SIP OF WATER:  - lansoprazole (PREVACID) 15 MG capsule, take one the night before and one on the morning of surgery - helps to prevent nausea after surgery. - amLODipine (NORVASC) 2.5 MG table - albuterol (VENTOLIN HFA) 108 (90 Base) MCG/ACT inhaler on the day of surgery use as directed and bring to the hospital.  DO NOT TAKE THE MORNING OF SURGERY: valsartan-hydrochlorothiazide (DIOVAN-HCT) 160-25 MG tablet.  One week prior to surgery: Stop Anti-inflammatories (NSAIDS) such as Advil, Aleve, Ibuprofen, Motrin, Naproxen, Naprosyn and Aspirin based products such as Excedrin, Goodys Powder, BC Powder . Stop ANY OVER THE COUNTER supplements until after surgery. (You may continue taking Tylenol, Vitamin D, Vitamin B, and multivitamin.)  No Alcohol for 24 hours before or  after surgery.  No Smoking including e-cigarettes for 24 hours prior to surgery.  No chewable tobacco products for at least 6 hours prior to surgery.  No nicotine patches on the day of surgery.  Do not use any "recreational" drugs for at least a week prior to your surgery.  Please be advised that the combination of cocaine and anesthesia may have negative outcomes, up to and including death. If you test positive for cocaine, your surgery will be cancelled.  On the morning of surgery brush your teeth with toothpaste and water, you may rinse your mouth with mouthwash if you wish. Do not swallow any toothpaste or mouthwash.  Do not wear lotions, powders, or perfumes.   Do not shave 48 hours prior to surgery.   Contact lenses, hearing aids and dentures may not be worn into surgery.  Do not bring valuables to the hospital. [facility] is not responsible for any missing/lost belongings or valuables.   Use CHG Soap or wipes as directed on instruction sheet.  Notify your doctor if there is any change in your medical condition (cold, fever, infection).  Wear comfortable clothing (specific to your surgery type) to the hospital.  Plan for stool softeners for home use; pain medications have a tendency to cause constipation. You can also help prevent constipation by eating foods high in fiber such as fruits and vegetables and drinking plenty of fluids as your diet allows.  After surgery, you can help prevent lung complications by doing breathing exercises.  Take deep breaths and cough every 1-2  hours. Your doctor may order a device called an Incentive Spirometer to help you take deep breaths. When coughing or sneezing, hold a pillow firmly against your incision with both hands. This is called "splinting." Doing this helps protect your incision. It also decreases belly discomfort.  If you are being admitted to the hospital overnight, leave your suitcase in the car. After surgery it may be  brought to your room.  If you are being discharged the day of surgery, you will not be allowed to drive home. You will need a responsible adult (18 years or older) to drive you home and stay with you that night.   If you are taking public transportation, you will need to have a responsible adult (18 years or older) with you. Please confirm with your physician that it is acceptable to use public transportation.   Please call the [facility] at [phone] if you have any questions about these instructions.  Visitation Policy:  Patients undergoing a surgery or procedure may have one family member or support person with them as long as that person is not COVID-19 positive or experiencing its symptoms.  That person may remain in the waiting area during the procedure.  Inpatient Visitation Update:   In an effort to ensure the safety of our team members and our patients, we are implementing a change to our visitation policy:  Effective Monday, Aug. 9, at 7 a.m., inpatients will be allowed one support person.  o The support person may change daily.  o The support person must pass our screening, gel in and out, and wear a mask at all times, including in the patient's room.  o Patients must also wear a mask when staff or their support person are in the room.  o Masking is required regardless of vaccination status.  Systemwide, no visitors 17 or younger.

## 2019-12-16 ENCOUNTER — Other Ambulatory Visit
Admission: RE | Admit: 2019-12-16 | Discharge: 2019-12-16 | Disposition: A | Discharge status: Home / Self Care | Payer: Medicare Other | Source: Ambulatory Visit | Attending: General Surgery | Admitting: General Surgery

## 2019-12-16 ENCOUNTER — Other Ambulatory Visit: Payer: Self-pay

## 2019-12-16 DIAGNOSIS — Z01818 Encounter for other preprocedural examination: Secondary | ICD-10-CM | POA: Diagnosis not present

## 2019-12-16 DIAGNOSIS — Z01810 Encounter for preprocedural cardiovascular examination: Secondary | ICD-10-CM | POA: Diagnosis not present

## 2019-12-16 DIAGNOSIS — I10 Essential (primary) hypertension: Secondary | ICD-10-CM | POA: Diagnosis not present

## 2019-12-16 DIAGNOSIS — Z20822 Contact with and (suspected) exposure to covid-19: Secondary | ICD-10-CM | POA: Diagnosis not present

## 2019-12-17 ENCOUNTER — Other Ambulatory Visit: Payer: Self-pay

## 2019-12-17 ENCOUNTER — Ambulatory Visit: Payer: Medicare Other | Admitting: Certified Registered"

## 2019-12-17 ENCOUNTER — Encounter: Payer: Self-pay | Admitting: General Surgery

## 2019-12-17 ENCOUNTER — Ambulatory Visit
Admission: RE | Admit: 2019-12-17 | Discharge: 2019-12-17 | Disposition: A | Discharge status: Home / Self Care | Payer: Medicare Other | Source: Ambulatory Visit | Attending: General Surgery | Admitting: General Surgery

## 2019-12-17 ENCOUNTER — Ambulatory Visit: Payer: Medicare Other

## 2019-12-17 ENCOUNTER — Ambulatory Visit
Admission: RE | Admit: 2019-12-17 | Discharge: 2019-12-17 | Disposition: A | Discharge status: Home / Self Care | Payer: Medicare Other | Attending: General Surgery | Admitting: General Surgery

## 2019-12-17 ENCOUNTER — Encounter
Admission: RE | Disposition: A | Discharge status: Home / Self Care | Payer: Self-pay | Source: Home / Self Care | Attending: General Surgery

## 2019-12-17 DIAGNOSIS — C4359 Malignant melanoma of other part of trunk: Secondary | ICD-10-CM | POA: Diagnosis present

## 2019-12-17 HISTORY — PX: LYMPH NODE BIOPSY: SHX201

## 2019-12-17 LAB — SARS CORONAVIRUS 2 (TAT 6-24 HRS): SARS Coronavirus 2: NEGATIVE

## 2019-12-17 SURGERY — LYMPH NODE BIOPSY
Anesthesia: General | Site: Back | Laterality: Bilateral

## 2019-12-17 MED ORDER — FENTANYL CITRATE (PF) 100 MCG/2ML IJ SOLN
INTRAMUSCULAR | Status: AC
  Filled 2019-12-17: qty 2

## 2019-12-17 MED ORDER — ONDANSETRON HCL 4 MG/2ML IJ SOLN
INTRAMUSCULAR | Status: DC | PRN
  Administered 2019-12-17: 4 mg via INTRAVENOUS

## 2019-12-17 MED ORDER — CEFAZOLIN SODIUM-DEXTROSE 2-4 GM/100ML-% IV SOLN
2.0000 g | INTRAVENOUS | Status: AC
  Administered 2019-12-17: 2 g via INTRAVENOUS

## 2019-12-17 MED ORDER — FENTANYL CITRATE (PF) 100 MCG/2ML IJ SOLN
INTRAMUSCULAR | Status: DC | PRN
  Administered 2019-12-17 (×3): 25 ug via INTRAVENOUS

## 2019-12-17 MED ORDER — EPHEDRINE SULFATE 50 MG/ML IJ SOLN
INTRAMUSCULAR | Status: DC | PRN
  Administered 2019-12-17 (×5): 10 mg via INTRAVENOUS

## 2019-12-17 MED ORDER — PROPOFOL 10 MG/ML IV BOLUS
INTRAVENOUS | Status: AC
  Filled 2019-12-17: qty 20

## 2019-12-17 MED ORDER — LIDOCAINE HCL (CARDIAC) PF 100 MG/5ML IV SOSY
PREFILLED_SYRINGE | INTRAVENOUS | Status: DC | PRN
  Administered 2019-12-17: 100 mg via INTRAVENOUS

## 2019-12-17 MED ORDER — BUPIVACAINE-EPINEPHRINE (PF) 0.5% -1:200000 IJ SOLN
INTRAMUSCULAR | Status: DC | PRN
  Administered 2019-12-17: 30 mL

## 2019-12-17 MED ORDER — DEXAMETHASONE SODIUM PHOSPHATE 10 MG/ML IJ SOLN
INTRAMUSCULAR | Status: AC
  Filled 2019-12-17: qty 1

## 2019-12-17 MED ORDER — ONDANSETRON HCL 4 MG/2ML IJ SOLN: 4.0000 mg | Freq: Once | INTRAMUSCULAR | Status: DC | PRN

## 2019-12-17 MED ORDER — CHLORHEXIDINE GLUCONATE 0.12 % MT SOLN: 15.0000 mL | Freq: Once | OROMUCOSAL | Status: AC

## 2019-12-17 MED ORDER — ORAL CARE MOUTH RINSE: 15.0000 mL | Freq: Once | OROMUCOSAL | Status: AC

## 2019-12-17 MED ORDER — HYDROCODONE-ACETAMINOPHEN 5-325 MG PO TABS
1.0000 | ORAL_TABLET | ORAL | 0 refills | Status: AC | PRN
Start: 2019-12-17 — End: 2019-12-20

## 2019-12-17 MED ORDER — MIDAZOLAM HCL 2 MG/2ML IJ SOLN
INTRAMUSCULAR | Status: DC | PRN
  Administered 2019-12-17: 2 mg via INTRAVENOUS

## 2019-12-17 MED ORDER — MIDAZOLAM HCL 2 MG/2ML IJ SOLN
INTRAMUSCULAR | Status: AC
  Filled 2019-12-17: qty 2

## 2019-12-17 MED ORDER — FENTANYL CITRATE (PF) 100 MCG/2ML IJ SOLN: 25.0000 ug | INTRAMUSCULAR | Status: DC | PRN

## 2019-12-17 MED ORDER — ACETAMINOPHEN 10 MG/ML IV SOLN
INTRAVENOUS | Status: AC
  Filled 2019-12-17: qty 100

## 2019-12-17 MED ORDER — CEFAZOLIN SODIUM-DEXTROSE 2-4 GM/100ML-% IV SOLN
INTRAVENOUS | Status: AC
  Filled 2019-12-17: qty 100

## 2019-12-17 MED ORDER — ACETAMINOPHEN 10 MG/ML IV SOLN
INTRAVENOUS | Status: DC | PRN
  Administered 2019-12-17: 1000 mg via INTRAVENOUS

## 2019-12-17 MED ORDER — PHENYLEPHRINE HCL (PRESSORS) 10 MG/ML IV SOLN
INTRAVENOUS | Status: DC | PRN
  Administered 2019-12-17 (×4): 50 ug via INTRAVENOUS

## 2019-12-17 MED ORDER — DEXAMETHASONE SODIUM PHOSPHATE 10 MG/ML IJ SOLN
INTRAMUSCULAR | Status: DC | PRN
  Administered 2019-12-17: 5 mg via INTRAVENOUS

## 2019-12-17 MED ORDER — TECHNETIUM TC 99M SULFUR COLLOID FILTERED
0.5230 | Freq: Once | INTRAVENOUS | Status: AC | PRN
  Administered 2019-12-17: 0.523 via INTRADERMAL

## 2019-12-17 MED ORDER — PROPOFOL 10 MG/ML IV BOLUS
INTRAVENOUS | Status: DC | PRN
  Administered 2019-12-17: 140 mg via INTRAVENOUS

## 2019-12-17 MED ORDER — CHLORHEXIDINE GLUCONATE 0.12 % MT SOLN
OROMUCOSAL | Status: AC
  Administered 2019-12-17: 15 mL via OROMUCOSAL
  Filled 2019-12-17: qty 15

## 2019-12-17 MED ORDER — LACTATED RINGERS IV SOLN: INTRAVENOUS | Status: DC

## 2019-12-17 MED ORDER — ONDANSETRON HCL 4 MG/2ML IJ SOLN
INTRAMUSCULAR | Status: AC
  Filled 2019-12-17: qty 2

## 2019-12-17 SURGICAL SUPPLY — 38 items
ADH SKN CLS APL DERMABOND .7 (GAUZE/BANDAGES/DRESSINGS) ×2
APL PRP STRL LF DISP 70% ISPRP (MISCELLANEOUS) ×1
BLADE CLIPPER SURG (BLADE) ×2 IMPLANT
BLADE SURG 15 STRL LF DISP TIS (BLADE) ×1 IMPLANT
BLADE SURG 15 STRL SS (BLADE) ×2
CANISTER SUCT 1200ML W/VALVE (MISCELLANEOUS) ×2 IMPLANT
CHLORAPREP W/TINT 26 (MISCELLANEOUS) ×2 IMPLANT
CNTNR SPEC 2.5X3XGRAD LEK (MISCELLANEOUS)
CONT SPEC 4OZ STER OR WHT (MISCELLANEOUS)
CONT SPEC 4OZ STRL OR WHT (MISCELLANEOUS)
CONTAINER SPEC 2.5X3XGRAD LEK (MISCELLANEOUS) IMPLANT
COVER WAND RF STERILE (DRAPES) ×2 IMPLANT
DERMABOND ADVANCED (GAUZE/BANDAGES/DRESSINGS) ×2
DERMABOND ADVANCED .7 DNX12 (GAUZE/BANDAGES/DRESSINGS) ×2 IMPLANT
DRAPE CHEST BREAST 15X10 FENES (DRAPES) ×2 IMPLANT
DRAPE LAPAROTOMY TRNSV 106X77 (MISCELLANEOUS) IMPLANT
ELECT CAUTERY BLADE 6.4 (BLADE) ×2 IMPLANT
ELECT REM PT RETURN 9FT ADLT (ELECTROSURGICAL) ×2
ELECTRODE REM PT RTRN 9FT ADLT (ELECTROSURGICAL) ×1 IMPLANT
GLOVE BIO SURGEON STRL SZ 6.5 (GLOVE) ×4 IMPLANT
GLOVE BIOGEL PI IND STRL 6.5 (GLOVE) ×2 IMPLANT
GLOVE BIOGEL PI INDICATOR 6.5 (GLOVE) ×2
GOWN STRL REUS W/ TWL LRG LVL3 (GOWN DISPOSABLE) ×2 IMPLANT
GOWN STRL REUS W/TWL LRG LVL3 (GOWN DISPOSABLE) ×4
KIT TURNOVER KIT A (KITS) ×2 IMPLANT
NDL SAFETY ECLIPSE 18X1.5 (NEEDLE) IMPLANT
NEEDLE HYPO 18GX1.5 SHARP (NEEDLE)
NEEDLE HYPO 22GX1.5 SAFETY (NEEDLE) ×2 IMPLANT
PACK BASIN MINOR (MISCELLANEOUS) ×2 IMPLANT
SLEVE PROBE SENORX GAMMA FIND (MISCELLANEOUS) ×2 IMPLANT
SUT MNCRL 4-0 (SUTURE) ×2
SUT MNCRL 4-0 27XMFL (SUTURE) ×1
SUT VIC AB 3-0 SH 27 (SUTURE) ×4
SUT VIC AB 3-0 SH 27X BRD (SUTURE) ×2 IMPLANT
SUTURE MNCRL 4-0 27XMF (SUTURE) ×1 IMPLANT
SYR 10ML LL (SYRINGE) ×2 IMPLANT
SYR BULB IRRIG 60ML STRL (SYRINGE) ×2 IMPLANT
TAPE TRANSPORE STRL 2 31045 (GAUZE/BANDAGES/DRESSINGS) ×2 IMPLANT

## 2019-12-17 NOTE — Anesthesia Preprocedure Evaluation (Addendum)
Anesthesia Evaluation  Patient identified by MRN, date of birth, ID band Patient awake    Reviewed: Allergy & Precautions, NPO status , Patient's Chart, lab work & pertinent test results  History of Anesthesia Complications Negative for: history of anesthetic complications  Airway Mallampati: I       Dental   Pulmonary neg sleep apnea, neg COPD, Not current smoker,           Cardiovascular hypertension, Pt. on medications (-) Past MI and (-) CHF (-) dysrhythmias (-) Valvular Problems/Murmurs     Neuro/Psych neg Seizures    GI/Hepatic Neg liver ROS, GERD  Medicated and Controlled,  Endo/Other  neg diabetes  Renal/GU negative Renal ROS     Musculoskeletal   Abdominal   Peds  Hematology   Anesthesia Other Findings   Reproductive/Obstetrics                            Anesthesia Physical Anesthesia Plan  ASA: II  Anesthesia Plan: General   Post-op Pain Management:    Induction: Intravenous  PONV Risk Score and Plan: 2 and Ondansetron and Dexamethasone  Airway Management Planned: LMA  Additional Equipment:   Intra-op Plan:   Post-operative Plan:   Informed Consent: I have reviewed the patients History and Physical, chart, labs and discussed the procedure including the risks, benefits and alternatives for the proposed anesthesia with the patient or authorized representative who has indicated his/her understanding and acceptance.       Plan Discussed with:   Anesthesia Plan Comments:         Anesthesia Quick Evaluation

## 2019-12-17 NOTE — Anesthesia Procedure Notes (Signed)
Procedure Name: LMA Insertion Date/Time: 12/17/2019 1:05 PM Performed by: Doreen Salvage, CRNA Pre-anesthesia Checklist: Patient identified, Patient being monitored, Timeout performed, Emergency Drugs available and Suction available Patient Re-evaluated:Patient Re-evaluated prior to induction Oxygen Delivery Method: Circle system utilized Preoxygenation: Pre-oxygenation with 100% oxygen Induction Type: IV induction Ventilation: Mask ventilation without difficulty LMA: LMA inserted LMA Size: 4.0 Tube type: Oral Number of attempts: 1 Placement Confirmation: positive ETCO2 and breath sounds checked- equal and bilateral Tube secured with: Tape Dental Injury: Teeth and Oropharynx as per pre-operative assessment

## 2019-12-17 NOTE — Op Note (Signed)
Preoperative diagnosis: Melanoma of back  Postoperative diagnosis: Melanoma of back.   Procedure: Bilateral axillary sentinel lymph node biopsy  Anesthesia: GETA  Surgeon: Dr. Windell Moment  Wound Classification: Clean  Indications: Patient is a 69 y.o. male with a a 1 mm depth melanoma of back. Scintigraphy shows lympathic drainage to bilateral axillary nodes. Sentinel lymph node biopsy indicated for staging.   Findings: 1. Adequate signal of both axilla.  2. No gross abnormality of palpable disease.   Description of procedure: In the nuclear medicine suite, the back was injected with Tc-99 sulfur colloid around the resected lesion. The patient was taken to the operating room and placed supine on the operating table, and after general anesthesia the chest and bilateral axilla were prepped and draped in the usual sterile fashion. A time-out was completed verifying correct patient, procedure, site, positioning, and implant(s) and/or special equipment prior to beginning this procedure.   A hand-held gamma probe was used to identify the location of the hottest spot in the axilla. An incision was made around the caudal axillary hairline. Dissection was carried down until subdermal facias was advanced. The probe was placed and again, the point of maximal count was found. Dissection continue until nodule was identified. The probe was placed in contact with the node. The node was excised in its entirety.  Two additional hot spot were detected and the node was excised in similar fashion. No additional hot spots were identified. No clinically abnormal nodes were palpated.   The same procedure was done in the left axilla. Two hot lymph nodes were identified and removed.   The procedure was terminated. Hemostasis was achieved and the wound closed in layers with deep interrupted 3-0 Vicryl and skin was closed with subcuticular suture of Monocryl 4-0.   The patient tolerated the procedure well and was  taken to the postanesthesia care unit in stable condition.   Sentinel Node Biopsy Synoptic Operative Report  Operation performed with curative intent:No  Tracer(s) used to identify sentinel nodes in the upfront surgery (non-neoadjuvant) setting (select all that apply):Radioactive Tracer  Tracer(s) used to identify sentinel nodes in the neoadjuvant setting (select all that apply):N/A  All nodes (colored or non-colored) present at the end of a dye-filled lymphatic channel were removed:N/A  All significantly radioactive nodes were removed:Yes  All palpable suspicious nodes were removed:N/A  Biopsy-proven positive nodes marked with clips prior to chemotherapy were identified and removed:N/A    Specimen:  Right axillary sentinel lymph nodes #1, #2, #3                      Left axillary sentinel lymph nodes #1, #2  Complications: None  Estimated Blood Loss: 10 mL

## 2019-12-17 NOTE — Interval H&P Note (Signed)
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  The various methods of treatment have been discussed with the patient and family. After consideration of risks, benefits and other options for treatment, the patient has consented to  Procedure(s): Sentinel LYMPH NODE BIOPSY possible cervical, axillary, inguinal (N/A) as a surgical intervention.  The patient's history has been reviewed, patient examined, no change in status, stable for surgery.  I have reviewed the patient's chart and labs.  Questions were answered to the patient's satisfaction.     Herbert Pun

## 2019-12-17 NOTE — Discharge Instructions (Signed)
  Diet: Resume home heart healthy regular diet.   Activity: Increase activity as tolerated. Light activity and walking are encouraged. Do not drive or drink alcohol if taking narcotic pain medications.  Wound care: May shower with soapy water and pat dry (do not rub incisions), but no baths or submerging incision underwater until follow-up. (no swimming)   Medications: Resume all home medications. For mild to moderate pain: acetaminophen (Tylenol) or ibuprofen (if no kidney disease). Combining Tylenol with alcohol can substantially increase your risk of causing liver disease. Narcotic pain medications, if prescribed, can be used for severe pain, though may cause nausea, constipation, and drowsiness. Do not combine Tylenol and Norco within a 6 hour period as Norco contains Tylenol. If you do not need the narcotic pain medication, you do not need to fill the prescription.  Call office ([phone]) at any time if any questions, worsening pain, fevers/chills, bleeding, drainage from incision site, or other concerns.  

## 2019-12-18 NOTE — Anesthesia Postprocedure Evaluation (Signed)
Anesthesia Post Note  Patient: Jonathan Dixon, Dr.  Jule Ser) Performed: Sentinel LYMPH NODE BIOPSY- AXILLARY (Bilateral Back)  Patient location during evaluation: PACU Anesthesia Type: General Level of consciousness: awake and alert Pain management: pain level controlled Vital Signs Assessment: post-procedure vital signs reviewed and stable Respiratory status: spontaneous breathing and respiratory function stable Cardiovascular status: stable Anesthetic complications: no   No complications documented.   Last Vitals:  Vitals:   12/17/19 1542 12/17/19 1553  BP: (!) 170/93 (!) 146/89  Pulse: 74 76  Resp: 18 18  Temp: 36.4 C 36.4 C  SpO2: 97% 97%    Last Pain:  Vitals:   12/17/19 1553  TempSrc: Temporal  PainSc: 2                  Darrall Strey K

## 2019-12-18 NOTE — Transfer of Care (Signed)
  Immediate Anesthesia Transfer of Care Note  Patient: Jonathan Dixon, Dr.  Jule Ser) Performed: Procedure(s): Sentinel LYMPH NODE BIOPSY- AXILLARY (Bilateral)  Patient Location: PACU  Anesthesia Type:General  Level of Consciousness: sedated  Airway & Oxygen Therapy: Patient Spontanous Breathing and Patient connected to face mask oxygen  Post-op Assessment: Report given to RN and Post -op Vital signs reviewed and stable  Post vital signs: Reviewed and stable  Last Vitals:  Vitals:   12/17/19 1542 12/17/19 1553  BP: (!) 170/93 (!) 146/89  Pulse: 74 76  Resp: 18 18  Temp: 36.4 C 36.4 C  SpO2: 02% 54%    Complications: No apparent anesthesia complications

## 2019-12-19 ENCOUNTER — Encounter: Payer: Self-pay | Admitting: General Surgery

## 2019-12-22 LAB — SURGICAL PATHOLOGY

## 2020-02-28 ENCOUNTER — Ambulatory Visit: Payer: Medicare Other | Attending: Internal Medicine

## 2020-02-28 DIAGNOSIS — Z23 Encounter for immunization: Secondary | ICD-10-CM

## 2020-02-28 NOTE — Progress Notes (Signed)
   Covid-19 Vaccination Clinic  Name:  Jonathan Dixon, Dr.    MRN: [id number] DOB: 02-23-1950  02/28/2020  Jonathan Dixon was observed post Covid-19 immunization for 15 minutes without incident. He was provided with Vaccine Information Sheet and instruction to access the V-Safe system.   Jonathan Dixon was instructed to call 911 with any severe reactions post vaccine: Marland Kitchen Difficulty breathing  . Swelling of face and throat  . A fast heartbeat  . A bad rash all over body  . Dizziness and weakness   Immunizations Administered    Name Date Dose VIS Date Route   JANSSEN COVID-19 VACCINE 02/28/2020  3:41 PM 0.5 mL 12/08/2019 Intramuscular   Manufacturer: [employer]   Lot: [id number]   [place]: [phone]

## 2020-05-31 LAB — CBC AND DIFFERENTIAL
Hemoglobin: 15.9 (ref 13.5–17.5)
Platelets: 273 (ref 150–399)
WBC: 8.1

## 2020-05-31 LAB — PSA: PSA: 1

## 2020-05-31 LAB — BASIC METABOLIC PANEL
Creatinine: 0.9 (ref 0.6–1.3)
Glucose: 104

## 2020-05-31 LAB — LIPID PANEL
Cholesterol: 139 (ref 0–200)
HDL: 37 (ref 35–70)
Triglycerides: 129 (ref 40–160)

## 2020-05-31 LAB — HEPATIC FUNCTION PANEL
ALT: 27 (ref 10–40)
AST: 31 (ref 14–40)

## 2020-05-31 LAB — TSH: TSH: 2.71 (ref 0.41–5.90)

## 2020-05-31 LAB — HEMOGLOBIN A1C: Hemoglobin A1C: 6

## 2020-06-16 ENCOUNTER — Encounter: Payer: Self-pay | Admitting: Plastic Surgery

## 2020-06-16 ENCOUNTER — Other Ambulatory Visit (HOSPITAL_COMMUNITY)
Admission: RE | Admit: 2020-06-16 | Discharge: 2020-06-16 | Disposition: A | Discharge status: Home / Self Care | Payer: Medicare Other | Source: Ambulatory Visit | Attending: Plastic Surgery | Admitting: Plastic Surgery

## 2020-06-16 ENCOUNTER — Other Ambulatory Visit: Payer: Self-pay

## 2020-06-16 ENCOUNTER — Ambulatory Visit (INDEPENDENT_AMBULATORY_CARE_PROVIDER_SITE_OTHER): Payer: Self-pay | Admitting: Plastic Surgery

## 2020-06-16 DIAGNOSIS — C439 Malignant melanoma of skin, unspecified: Secondary | ICD-10-CM | POA: Diagnosis present

## 2020-06-16 NOTE — Progress Notes (Signed)
Procedure Note  Preoperative Dx: melanoma in-situ right neck  Postoperative Dx: Same  Procedure: Excision of melanoma in-situ right neck 2 x 3 cm  Anesthesia: Lidocaine 1% with 1:100,000 epinepherine  Indication for Procedure: melanoma  Description of Procedure: Risks and complications were explained to the patient.  Consent was confirmed and the patient understands the risks and benefits.  The potential complications and alternatives were explained and the patient consents.  The patient expressed understanding the option of not having the procedure and the risks of a scar.  Time out was called and all information was confirmed to be correct.    The area was prepped and drapped.  Lidocaine 1% with epinepherine was injected in the subcutaneous area.  After waiting several minutes for the local to take affect a #15 blade was used to excise the area in an eliptical pattern with 5 mm borders from the biopsy site.  A 4-0 Monocryl was used to close the deep layers with simple interrupted stitches.  The skin edges were reapproximated with 5-0 Monocryl subcuticular running closure.  A dressing was applied.  The patient was given instructions on how to care for the area and a follow up appointment.  Jonathan Dixon tolerated the procedure well and there were no complications. The specimen was sent to pathology.

## 2020-06-16 NOTE — Addendum Note (Signed)
Addended by: Wallace Going on: 06/16/2020 03:05 PM   Modules accepted: Orders

## 2020-06-20 LAB — SURGICAL PATHOLOGY

## 2020-08-08 ENCOUNTER — Other Ambulatory Visit: Payer: Medicare Other

## 2020-08-15 ENCOUNTER — Encounter: Payer: Medicare Other | Admitting: Family Medicine

## 2020-08-25 ENCOUNTER — Other Ambulatory Visit: Payer: Self-pay

## 2020-08-25 ENCOUNTER — Encounter: Payer: Self-pay | Admitting: Family Medicine

## 2020-08-25 ENCOUNTER — Ambulatory Visit (INDEPENDENT_AMBULATORY_CARE_PROVIDER_SITE_OTHER): Payer: Medicare Other | Admitting: Family Medicine

## 2020-08-25 VITALS — BP 122/68 | HR 61 | Temp 98.2°F | Ht 70.0 in | Wt 210.0 lb

## 2020-08-25 DIAGNOSIS — N529 Male erectile dysfunction, unspecified: Secondary | ICD-10-CM | POA: Diagnosis not present

## 2020-08-25 DIAGNOSIS — C439 Malignant melanoma of skin, unspecified: Secondary | ICD-10-CM

## 2020-08-25 DIAGNOSIS — Z0000 Encounter for general adult medical examination without abnormal findings: Secondary | ICD-10-CM | POA: Diagnosis not present

## 2020-08-25 DIAGNOSIS — E781 Pure hyperglyceridemia: Secondary | ICD-10-CM | POA: Diagnosis not present

## 2020-08-25 DIAGNOSIS — I10 Essential (primary) hypertension: Secondary | ICD-10-CM

## 2020-08-25 DIAGNOSIS — Z7189 Other specified counseling: Secondary | ICD-10-CM

## 2020-08-25 MED ORDER — LANSOPRAZOLE 15 MG PO CPDR
15.0000 mg | DELAYED_RELEASE_CAPSULE | Freq: Every day | ORAL | 3 refills | Status: DC
Start: 2020-08-25 — End: 2021-08-31

## 2020-08-25 MED ORDER — ALBUTEROL SULFATE HFA 108 (90 BASE) MCG/ACT IN AERS
1.0000 | INHALATION_SPRAY | Freq: Four times a day (QID) | RESPIRATORY_TRACT | 1 refills | Status: DC | PRN
Start: 2020-08-25 — End: 2021-08-22

## 2020-08-25 MED ORDER — SILDENAFIL CITRATE 20 MG PO TABS
20.0000 mg | ORAL_TABLET | Freq: Every day | ORAL | 12 refills | Status: AC | PRN
Start: 2020-08-25 — End: ?

## 2020-08-25 MED ORDER — VALSARTAN-HYDROCHLOROTHIAZIDE 160-25 MG PO TABS
1.0000 | ORAL_TABLET | Freq: Every day | ORAL | 3 refills | Status: DC
Start: 2020-08-25 — End: 2021-08-31

## 2020-08-25 MED ORDER — ROSUVASTATIN CALCIUM 5 MG PO TABS
5.0000 mg | ORAL_TABLET | Freq: Every day | ORAL | 3 refills | Status: DC
Start: 2020-08-25 — End: 2021-08-31

## 2020-08-25 NOTE — Patient Instructions (Signed)
Keep working on diet and exercise.  Update me as needed.  Take care.  Glad to see you.

## 2020-08-25 NOTE — Progress Notes (Signed)
This visit occurred during the SARS-CoV-2 public health emergency.  Safety protocols were in place, including screening questions prior to the visit, additional usage of staff PPE, and extensive cleaning of exam room while observing appropriate contact time as indicated for disinfecting solutions.  Has been on medicare since 19.    I have personally reviewed the Medicare Annual Wellness questionnaire and have noted 1. The patient's medical and social history 2. Their use of alcohol, tobacco or illicit drugs 3. Their current medications and supplements 4. The patient's functional ability including ADL's, fall risks, home safety risks and hearing or visual             impairment. 5. Diet and physical activities 6. Evidence for depression or mood disorders  The patients weight, height, BMI have been recorded in the chart and visual acuity is per eye clinic.  I have made referrals, counseling and provided education to the patient based review of the above and I have provided the pt with a written personalized care plan for preventive services.  Provider list updated- see scanned forms.  Routine anticipatory guidance given to patient.  See health maintenance. The possibility exists that previously documented standard health maintenance information may have been brought forward from a previous encounter into this note.  If needed, that same information has been updated to reflect the current situation based on today's encounter.    Flu 2021 at work Shingles discussed with patient PNA discussed with patient Tetanus 2022 COVID-vaccine up-to-date Colonoscopy 2021 Prostate cancer screening 2022 Advance directive-wife designated patient were incapacitated. Cognitive function addressed- see scanned forms- and if abnormal then additional documentation follows.   In addition to [facility], follow up visit for the below conditions:  Hypertension:    Using medication without problems or  lightheadedness: yes Chest pain with exertion:no Edema:no Short of breath:no Was on CCB per cards for [facility] with relief of sx.    Elevated Cholesterol: Using medications without problems: yes Muscle aches: no Diet compliance: yes Exercise: yes Labs d/w pt.    H/o melanoma per dermatology, with neg lymph biopsy and no known issues currently .  ED improved with sildenafil 100mg .  No ADE on med except for some leg cramping.  No cramping with walking o/w.    PMH and SH reviewed  Meds, vitals, and allergies reviewed.   ROS: Per HPI.  Unless specifically indicated otherwise in HPI, the patient denies:  General: fever. Eyes: acute vision changes ENT: sore throat Cardiovascular: chest pain Respiratory: SOB GI: vomiting GU: dysuria Musculoskeletal: acute back pain Derm: acute rash Neuro: acute motor dysfunction Psych: worsening mood Endocrine: polydipsia Heme: bleeding Allergy: hayfever  GEN: nad, alert and oriented HEENT: ncat NECK: supple w/o LA CV: rrr. PULM: ctab, no inc wob ABD: soft, +bs EXT: no edema SKIN: no acute rash, healed melanoma resection site on the trunk.

## 2020-08-27 NOTE — Assessment & Plan Note (Signed)
ED improved with sildenafil 100mg .  No ADE on med except for some leg cramping.  No cramping with walking o/w.   Continue as is with as needed use.  He will update me as needed.

## 2020-08-27 NOTE — Assessment & Plan Note (Signed)
Status post resection per dermatology.  I will defer.  He agrees with plan.

## 2020-08-27 NOTE — Assessment & Plan Note (Signed)
Advance directive-wife designated patient were incapacitated. 

## 2020-08-27 NOTE — Assessment & Plan Note (Signed)
Continue diltiazem.  Continue valsartan hydrochlorothiazide.  Labs discussed with patient.

## 2020-08-27 NOTE — Assessment & Plan Note (Signed)
Continue Crestor.  Labs discussed with patient.  Diet and exercise discussed with patient.

## 2020-08-27 NOTE — Assessment & Plan Note (Signed)
Flu 2021 at work Shingles discussed with patient PNA discussed with patient Tetanus 2022 COVID-vaccine up-to-date Colonoscopy 2021 Prostate cancer screening 2022 Advance directive-wife designated patient were incapacitated. Cognitive function addressed- see scanned forms- and if abnormal then additional documentation follows.

## 2020-08-28 ENCOUNTER — Encounter: Payer: Self-pay | Admitting: Family Medicine

## 2020-11-17 ENCOUNTER — Other Ambulatory Visit: Payer: Self-pay | Admitting: General Surgery

## 2020-11-22 LAB — SURGICAL PATHOLOGY

## 2021-06-05 LAB — PSA: PSA: 1

## 2021-06-05 LAB — CBC AND DIFFERENTIAL
Hemoglobin: 13.4 — AB (ref 13.5–17.5)
Platelets: 288 10*3/uL (ref 150–400)
WBC: 7.8

## 2021-06-05 LAB — TSH: TSH: 2.12 (ref <=5.90)

## 2021-06-05 LAB — BASIC METABOLIC PANEL
Creatinine: 0.9 (ref <=1.3)
Glucose: 105

## 2021-06-05 LAB — LIPID PANEL
Cholesterol: 131 (ref 0–200)
HDL: 40 (ref 35–70)
LDL Cholesterol: 75
Triglycerides: 81 (ref 40–160)

## 2021-06-05 LAB — HEPATIC FUNCTION PANEL
ALT: 22 U/L (ref 10–40)
AST: 24 (ref 14–40)

## 2021-06-05 LAB — HEMOGLOBIN A1C: Hemoglobin A1C: 5.9

## 2021-06-05 LAB — CBC: RBC: 4.5 (ref 3.87–5.11)

## 2021-08-01 ENCOUNTER — Other Ambulatory Visit: Payer: Self-pay | Admitting: General Surgery

## 2021-08-02 LAB — SURGICAL PATHOLOGY

## 2021-08-20 ENCOUNTER — Encounter: Payer: Self-pay | Admitting: Family Medicine

## 2021-08-20 ENCOUNTER — Other Ambulatory Visit: Payer: Self-pay | Admitting: Family Medicine

## 2021-08-20 MED ORDER — QVAR REDIHALER 80 MCG/ACT IN AERB: 1.0000 | INHALATION_SPRAY | Freq: Two times a day (BID) | RESPIRATORY_TRACT | 0 refills | Status: AC

## 2021-08-22 ENCOUNTER — Other Ambulatory Visit: Payer: Self-pay | Admitting: Family Medicine

## 2021-08-22 MED ORDER — ALBUTEROL SULFATE HFA 108 (90 BASE) MCG/ACT IN AERS: 1.0000 | INHALATION_SPRAY | Freq: Four times a day (QID) | RESPIRATORY_TRACT | 1 refills | Status: AC | PRN

## 2021-08-22 NOTE — Telephone Encounter (Signed)
Jonathan Dixon - Client Nonclinical Telephone Record  [facility] Client Jonathan Dixon - Client Client Site [place] [facility] Provider [facility] - PHYSICIAN, NOT LISTED- MD Contact Type Call Who Is Calling Physician / Provider / Hospital Call Type Provider Call Message Only Reason for Call Request to send message to Office Initial Comment Caller states she is calling from [facility] calling to update about a prior authorization outcome. Additional Comment Caller states she is calling regarding patient Jonathan Dixon date of birth 1950-07-06 for medication Qvar Redihaler 22mg. It has been denied because it is being used for NON-FDA approved use. She will fax form to provider. She advised member and he would like to appeal. Provided fax number [phone] Disp. Time Disposition Final User 08/20/2021 5:18:15 PM General Information Provided Yes Ignatowski, Tiffany Call Closed By: TGaye PollackTransaction Date/Time: 08/20/2021 5:14:47 PM (ET   Sending to Dr DDamita Dunningsand JJanett BillowCMA.

## 2021-08-22 NOTE — Telephone Encounter (Signed)
Received denial on PA for Qvar. Is there anything else you would like to try?

## 2021-08-25 ENCOUNTER — Other Ambulatory Visit: Payer: Self-pay | Admitting: Internal Medicine

## 2021-08-25 MED ORDER — PREDNISONE 10 MG PO TABS: ORAL_TABLET | ORAL | 0 refills | Status: AC

## 2021-08-25 MED ORDER — BENZONATATE 200 MG PO CAPS: 200.0000 mg | ORAL_CAPSULE | Freq: Three times a day (TID) | ORAL | 1 refills | Status: AC | PRN

## 2021-08-27 ENCOUNTER — Encounter: Payer: Medicare Other | Admitting: Family Medicine

## 2021-08-31 ENCOUNTER — Ambulatory Visit (INDEPENDENT_AMBULATORY_CARE_PROVIDER_SITE_OTHER): Payer: Medicare Other | Admitting: Family Medicine

## 2021-08-31 ENCOUNTER — Encounter: Payer: Self-pay | Admitting: Family Medicine

## 2021-08-31 VITALS — BP 132/84 | HR 62 | Temp 98.1°F | Ht 70.0 in | Wt 213.0 lb

## 2021-08-31 DIAGNOSIS — C439 Malignant melanoma of skin, unspecified: Secondary | ICD-10-CM

## 2021-08-31 DIAGNOSIS — I10 Essential (primary) hypertension: Secondary | ICD-10-CM

## 2021-08-31 DIAGNOSIS — N529 Male erectile dysfunction, unspecified: Secondary | ICD-10-CM | POA: Diagnosis not present

## 2021-08-31 DIAGNOSIS — Z0000 Encounter for general adult medical examination without abnormal findings: Secondary | ICD-10-CM

## 2021-08-31 DIAGNOSIS — K219 Gastro-esophageal reflux disease without esophagitis: Secondary | ICD-10-CM

## 2021-08-31 DIAGNOSIS — Z7189 Other specified counseling: Secondary | ICD-10-CM

## 2021-08-31 DIAGNOSIS — E781 Pure hyperglyceridemia: Secondary | ICD-10-CM | POA: Diagnosis not present

## 2021-08-31 MED ORDER — LANSOPRAZOLE 15 MG PO CPDR: 15.0000 mg | DELAYED_RELEASE_CAPSULE | Freq: Every day | ORAL | 3 refills | Status: AC

## 2021-08-31 MED ORDER — VALSARTAN-HYDROCHLOROTHIAZIDE 160-25 MG PO TABS: 1.0000 | ORAL_TABLET | Freq: Every day | ORAL | 3 refills | Status: AC

## 2021-08-31 MED ORDER — ROSUVASTATIN CALCIUM 5 MG PO TABS: 5.0000 mg | ORAL_TABLET | Freq: Every day | ORAL | 3 refills | Status: AC

## 2021-08-31 NOTE — Patient Instructions (Signed)
We'll get your labs entered.  Don't change your meds for now.  Take care.  Glad to see you. Update me as needed.  I'll await the notes from cardiology and dermatology.

## 2021-08-31 NOTE — Progress Notes (Unsigned)
I have personally reviewed the Medicare Annual Wellness questionnaire and have noted 1. The patient's medical and social history 2. Their use of alcohol, tobacco or illicit drugs 3. Their current medications and supplements 4. The patient's functional ability including ADL's, fall risks, home safety risks and hearing or visual             impairment. 5. Diet and physical activities 6. Evidence for depression or mood disorders  The patients weight, height, BMI have been recorded in the chart and visual acuity is per eye clinic.  I have made referrals, counseling and provided education to the patient based review of the above and I have provided the pt with a written personalized care plan for preventive services.  Provider list updated- see scanned forms.  Routine anticipatory guidance given to patient.  See health maintenance. The possibility exists that previously documented standard health maintenance information may have been brought forward from a previous encounter into this note.  If needed, that same information has been updated to reflect the current situation based on today's encounter.    Flu Shingles PNA Tetanus Colon  Breast cancer screening Prostate cancer screening Advance directive Cognitive function addressed- see scanned forms- and if abnormal then additional documentation follows.   In addition to [facility], follow up visit for the below conditions:  Cough is better on prednisone/tessalon.  He is better in the meantime.    Elevated Cholesterol: Using medications without problems: yes Muscle aches: no change in aches off med.  Diet compliance: yes Exercise: yes  Hypertension:    Using medication without problems or lightheadedness: yes Chest pain with exertion:no Edema:no Short of breath: no but some slower pacing with exertion and he is going to f/u with cardiology.    GERD controlled.    ED.  Improved with sildenafil. No NTG use.    H/o melanoma  per derm.  No tx currently.    PMH and SH reviewed  Meds, vitals, and allergies reviewed.   ROS: Per HPI.  Unless specifically indicated otherwise in HPI, the patient denies:  General: fever. Eyes: acute vision changes ENT: sore throat Cardiovascular: chest pain Respiratory: SOB GI: vomiting GU: dysuria Musculoskeletal: acute back pain Derm: acute rash Neuro: acute motor dysfunction Psych: worsening mood Endocrine: polydipsia Heme: bleeding Allergy: hayfever  GEN: nad, alert and oriented HEENT: ncat NECK: supple w/o LA CV: rrr. PULM: ctab, no inc wob ABD: soft, +bs EXT: no edema SKIN: no acute rash

## 2021-09-02 NOTE — Assessment & Plan Note (Signed)
Continue Crestor 

## 2021-09-02 NOTE — Assessment & Plan Note (Signed)
Continue lansoprazole.

## 2021-09-02 NOTE — Assessment & Plan Note (Signed)
Continue as needed sildenafil.  No nitroglycerin use.

## 2021-09-02 NOTE — Assessment & Plan Note (Signed)
Continue diltiazem and valsartan hydrochlorothiazide.  Previous labs discussed with patient.

## 2021-09-02 NOTE — Assessment & Plan Note (Signed)
Flu declined Shingles discussed with patient.  Previously had Zostavax.  Declined Shingrix. PNA declined updating pneumonia vaccine. Tetanus 2021 per patient report COVID-vaccine previously done. Routine indications for vaccines discussed with patient. Colonoscopy 2021 per Dr. Alice Reichert. Prostate cancer screening 2023 Advance directive-wife designated if patient were incapacitated. Cognitive function addressed- see scanned forms- and if abnormal then additional documentation follows.

## 2021-09-02 NOTE — Assessment & Plan Note (Signed)
No treatment currently, per dermatology.

## 2021-09-02 NOTE — Assessment & Plan Note (Signed)
Advance directive- wife designated if patient were incapacitated.  

## 2022-03-05 ENCOUNTER — Other Ambulatory Visit: Payer: Self-pay | Admitting: Pulmonary Disease

## 2022-03-05 ENCOUNTER — Ambulatory Visit
Admission: RE | Admit: 2022-03-05 | Discharge: 2022-03-05 | Disposition: A | Discharge status: Home / Self Care | Payer: Medicare Other | Source: Ambulatory Visit | Attending: Pulmonary Disease | Admitting: Pulmonary Disease

## 2022-03-05 DIAGNOSIS — R911 Solitary pulmonary nodule: Secondary | ICD-10-CM

## 2022-03-05 LAB — POCT I-STAT CREATININE: Creatinine, Ser: 0.7 mg/dL (ref 0.61–1.24)

## 2022-03-05 MED ORDER — IOHEXOL 300 MG/ML  SOLN
75.0000 mL | Freq: Once | INTRAMUSCULAR | Status: AC | PRN
  Administered 2022-03-05: 75 mL via INTRAVENOUS

## 2022-08-24 ENCOUNTER — Other Ambulatory Visit: Payer: Self-pay | Admitting: Family Medicine

## 2022-08-26 NOTE — Telephone Encounter (Signed)
Please call and get patient scheduled for wellness physical and send back to Dr. Lianne Bushy pool for medication refill.

## 2022-08-26 NOTE — Telephone Encounter (Signed)
LVM for patient to c/b and schedule.  

## 2022-08-26 NOTE — Telephone Encounter (Signed)
Spoke with patient and he stated that he doesn't need this medication. He also stated that he will be changing PCPs.

## 2022-09-23 ENCOUNTER — Encounter: Payer: Self-pay | Admitting: Family Medicine

## 2022-09-25 NOTE — Telephone Encounter (Signed)
Please remove me as the PCP.  See MyChart message.  Thanks.

## 2023-05-06 ENCOUNTER — Telehealth (INDEPENDENT_AMBULATORY_CARE_PROVIDER_SITE_OTHER): Payer: Self-pay

## 2023-05-06 ENCOUNTER — Other Ambulatory Visit (INDEPENDENT_AMBULATORY_CARE_PROVIDER_SITE_OTHER): Payer: Self-pay | Admitting: Vascular Surgery

## 2023-05-06 DIAGNOSIS — I10 Essential (primary) hypertension: Secondary | ICD-10-CM

## 2023-05-06 NOTE — Telephone Encounter (Signed)
 LVM for pt TCB and schedule   per GS - LO. renal + carotid

## 2023-05-07 ENCOUNTER — Ambulatory Visit (INDEPENDENT_AMBULATORY_CARE_PROVIDER_SITE_OTHER): Payer: Self-pay

## 2023-05-07 DIAGNOSIS — I10 Essential (primary) hypertension: Secondary | ICD-10-CM

## 2023-07-08 ENCOUNTER — Encounter (INDEPENDENT_AMBULATORY_CARE_PROVIDER_SITE_OTHER): Payer: Self-pay
